# Patient Record
Sex: Female | Born: 1990 | Race: Black or African American | Hispanic: No | Marital: Single | Smoking: Never smoker
Health system: Southern US, Community
[De-identification: ages and names within clinical notes are randomized; demographics above are authoritative.]

## PROBLEM LIST (undated history)

## (undated) ENCOUNTER — Inpatient Hospital Stay (HOSPITAL_COMMUNITY): Payer: Self-pay

## (undated) DIAGNOSIS — J45909 Unspecified asthma, uncomplicated: Secondary | ICD-10-CM

## (undated) DIAGNOSIS — B3731 Acute candidiasis of vulva and vagina: Secondary | ICD-10-CM

## (undated) DIAGNOSIS — B373 Candidiasis of vulva and vagina: Secondary | ICD-10-CM

## (undated) DIAGNOSIS — A599 Trichomoniasis, unspecified: Secondary | ICD-10-CM

## (undated) HISTORY — DX: Candidiasis of vulva and vagina: B37.3

## (undated) HISTORY — DX: Trichomoniasis, unspecified: A59.9

## (undated) HISTORY — DX: Unspecified asthma, uncomplicated: J45.909

## (undated) HISTORY — DX: Acute candidiasis of vulva and vagina: B37.31

---
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
## 2010-02-23 ENCOUNTER — Other Ambulatory Visit: Admission: RE | Admit: 2010-02-23 | Discharge: 2010-02-23 | Payer: Self-pay | Admitting: Family Medicine

## 2010-03-03 ENCOUNTER — Ambulatory Visit (HOSPITAL_COMMUNITY): Admission: RE | Admit: 2010-03-03 | Discharge: 2010-03-03 | Payer: Self-pay | Admitting: Endocrinology

## 2012-06-07 ENCOUNTER — Telehealth: Payer: Self-pay | Admitting: Obstetrics and Gynecology

## 2012-06-07 ENCOUNTER — Encounter: Payer: Self-pay | Admitting: Obstetrics and Gynecology

## 2012-06-07 ENCOUNTER — Ambulatory Visit (INDEPENDENT_AMBULATORY_CARE_PROVIDER_SITE_OTHER): Payer: BC Managed Care – PPO | Admitting: Obstetrics and Gynecology

## 2012-06-07 VITALS — BP 110/72 | HR 82 | Resp 16 | Ht 64.0 in | Wt 156.0 lb

## 2012-06-07 DIAGNOSIS — Z124 Encounter for screening for malignant neoplasm of cervix: Secondary | ICD-10-CM

## 2012-06-07 DIAGNOSIS — B9689 Other specified bacterial agents as the cause of diseases classified elsewhere: Secondary | ICD-10-CM

## 2012-06-07 DIAGNOSIS — Z01419 Encounter for gynecological examination (general) (routine) without abnormal findings: Secondary | ICD-10-CM

## 2012-06-07 DIAGNOSIS — A499 Bacterial infection, unspecified: Secondary | ICD-10-CM

## 2012-06-07 DIAGNOSIS — Z319 Encounter for procreative management, unspecified: Secondary | ICD-10-CM

## 2012-06-07 DIAGNOSIS — N760 Acute vaginitis: Secondary | ICD-10-CM

## 2012-06-07 LAB — HEPATITIS B SURFACE ANTIGEN: Hepatitis B Surface Ag: NEGATIVE

## 2012-06-07 LAB — POCT WET PREP (WET MOUNT)

## 2012-06-07 LAB — PROGESTERONE: Progesterone: 11.7 ng/mL

## 2012-06-07 LAB — TSH: TSH: 0.97 u[IU]/mL (ref 0.350–4.500)

## 2012-06-07 MED ORDER — METRONIDAZOLE 0.75 % VA GEL: 1.0000 | Freq: Every day | VAGINAL | Status: DC

## 2012-06-07 MED ORDER — METRONIDAZOLE 500 MG PO TABS: 500.0000 mg | ORAL_TABLET | Freq: Two times a day (BID) | ORAL | Status: DC

## 2012-06-07 MED ORDER — METRONIDAZOLE 500 MG PO TABS: 500.0000 mg | ORAL_TABLET | Freq: Two times a day (BID) | ORAL | Status: AC

## 2012-06-07 MED ORDER — METRONIDAZOLE 0.75 % VA GEL: 1.0000 | Freq: Every day | VAGINAL | Status: AC

## 2012-06-07 NOTE — Patient Instructions (Signed)
Bacterial Vaginosis Bacterial vaginosis (BV) is a vaginal infection where the normal balance of bacteria in the vagina is disrupted. The normal balance is then replaced by an overgrowth of certain bacteria. There are several different kinds of bacteria that can cause BV. BV is the most common vaginal infection in women of childbearing age. CAUSES   The cause of BV is not fully understood. BV develops when there is an increase or imbalance of harmful bacteria.   Some activities or behaviors can upset the normal balance of bacteria in the vagina and put women at increased risk including:   Having a new sex partner or multiple sex partners.   Douching.   Using an intrauterine device (IUD) for contraception.   It is not clear what role sexual activity plays in the development of BV. However, women that have never had sexual intercourse are rarely infected with BV.  Women do not get BV from toilet seats, bedding, swimming pools or from touching objects around them.  SYMPTOMS   Grey vaginal discharge.   A fish-like odor with discharge, especially after sexual intercourse.   Itching or burning of the vagina and vulva.   Burning or pain with urination.   Some women have no signs or symptoms at all.  DIAGNOSIS  Your caregiver must examine the vagina for signs of BV. Your caregiver will perform lab tests and look at the sample of vaginal fluid through a microscope. They will look for bacteria and abnormal cells (clue cells), a pH test higher than 4.5, and a positive amine test all associated with BV.  RISKS AND COMPLICATIONS   Pelvic inflammatory disease (PID).   Infections following gynecology surgery.   Developing HIV.   Developing herpes virus.  TREATMENT  Sometimes BV will clear up without treatment. However, all women with symptoms of BV should be treated to avoid complications, especially if gynecology surgery is planned. Female partners generally do not need to be treated. However,  BV may spread between female sex partners so treatment is helpful in preventing a recurrence of BV.   BV may be treated with antibiotics. The antibiotics come in either pill or vaginal cream forms. Either can be used with nonpregnant or pregnant women, but the recommended dosages differ. These antibiotics are not harmful to the baby.   BV can recur after treatment. If this happens, a second round of antibiotics will often be prescribed.   Treatment is important for pregnant women. If not treated, BV can cause a premature delivery, especially for a pregnant woman who had a premature birth in the past. All pregnant women who have symptoms of BV should be checked and treated.   For chronic reoccurrence of BV, treatment with a type of prescribed gel vaginally twice a week is helpful.  HOME CARE INSTRUCTIONS   Finish all medication as directed by your caregiver.   Do not have sex until treatment is completed.   Tell your sexual partner that you have a vaginal infection. They should see their caregiver and be treated if they have problems, such as a mild rash or itching.   Practice safe sex. Use condoms. Only have 1 sex partner.  PREVENTION  Basic prevention steps can help reduce the risk of upsetting the natural balance of bacteria in the vagina and developing BV:  Do not have sexual intercourse (be abstinent).   Do not douche.   Use all of the medicine prescribed for treatment of BV, even if the signs and symptoms go away.     Tell your sex partner if you have BV. That way, they can be treated, if needed, to prevent reoccurrence.  SEEK MEDICAL CARE IF:   Your symptoms are not improving after 3 days of treatment.   You have increased discharge, pain, or fever.  MAKE SURE YOU:   Understand these instructions.   Will watch your condition.   Will get help right away if you are not doing well or get worse.  FOR MORE INFORMATION  Division of STD Prevention (DSTDP), Centers for Disease  Control and Prevention: www.cdc.gov/std American Social Health Association (ASHA): www.ashastd.org  Document Released: 09/11/2005 Document Revised: 08/31/2011 Document Reviewed: 03/04/2009 Exit[employer] Patient Information 2012 [employer]. 

## 2012-06-07 NOTE — Telephone Encounter (Signed)
Spoke with pt rgd msg informed rx sent to pharm pt voice understanding 

## 2012-06-07 NOTE — Progress Notes (Signed)
Last Pap: per pt cannot recall WNL: NO Regular Periods:yes Contraception: Condoms smetimes  Monthly Breast exam:no Tetanus<19yrs:yes Nl.Bladder Function:yes Daily BMs:yes Healthy Diet:yes Calcium:no Mammogram:no Date of Mammogram: N//A Exercise:yes Have often Exercise: daily walking Seatbelt: yes Abuse at home: no Stressful work:no Sigmoid-colonoscopy: N/A Bone Density: No PCP: [facility] Dr.Einger Change in PMH: No Changes Change in FMH:No Changes.  Color: N/A Odor: yes Itching:no Thin:no Thick:no Fever:no Dyspareunia:no Hx PID:no HX STD:yes Pelvic Pain:no Desires Gc/CT:yes Desires HIV,RPR,HbsAG:yes BP 110/72  Pulse 82  Resp 16  Ht 5\' 4"  (1.626 m)  Wt 156 lb (70.761 kg)  BMI 26.78 kg/m2  LMP 05/18/2012 Physical Examination: General appearance - alert, well appearing, and in no distress Chest - clear to auscultation, no wheezes, rales or rhonchi, symmetric air entry Heart - normal rate and regular rhythm Abdomen - soft, nontender, nondistended, no masses or organomegaly Breasts - breasts appear normal, no suspicious masses, no skin or nipple changes or axillary nodes Pelvic - normal external genitalia, vulva, vagina, cervix, uterus and adnexa Musculoskeletal - no joint tenderness, deformity or swelling Wet prep c/ BV Recurrent BV metrogel and flagyl given.  Perineal hygeine reviewed.  Pap sent.

## 2012-06-08 LAB — RPR

## 2012-06-10 LAB — HSV 2 ANTIBODY, IGG: HSV 2 Glycoprotein G Ab, IgG: 14.58 IV — ABNORMAL HIGH

## 2012-06-10 LAB — HSV 1 ANTIBODY, IGG: HSV 1 Glycoprotein G Ab, IgG: <0.1 IV

## 2012-06-11 LAB — PAP IG, CT-NG, RFX HPV ASCU: GC Probe Amp: NEGATIVE

## 2012-06-14 ENCOUNTER — Telehealth: Payer: Self-pay

## 2012-06-14 NOTE — Telephone Encounter (Signed)
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

## 2012-06-14 NOTE — Telephone Encounter (Signed)
Spoke with pt rgd labs informed hsv 2 positive explained hsv 2 to pt afvised if outbreak occur call office for rx also informed pt pap showed abnl cells need colpo pt has appt 07/12/12 at 3:15 with ND pt voice understading

## 2012-07-01 ENCOUNTER — Encounter: Payer: BC Managed Care – PPO | Admitting: Obstetrics and Gynecology

## 2012-07-12 ENCOUNTER — Encounter: Payer: BC Managed Care – PPO | Admitting: Obstetrics and Gynecology

## 2012-08-02 ENCOUNTER — Telehealth: Payer: Self-pay | Admitting: Obstetrics and Gynecology

## 2012-08-02 NOTE — Telephone Encounter (Signed)
ND pt 

## 2012-08-05 ENCOUNTER — Telehealth: Payer: Self-pay

## 2012-08-05 NOTE — Telephone Encounter (Signed)
Try calling pt rgd r/s colpo no answer unable to leave msg 

## 2012-08-07 NOTE — Telephone Encounter (Signed)
Lm on vm tcb rgd r/s colpo

## 2012-08-08 NOTE — Telephone Encounter (Signed)
Spoke with pt rgd r/s colpo pt r/s colpo to 10/05/11 at 2:45 with Nd pt voice understanding

## 2012-08-08 NOTE — Telephone Encounter (Signed)
Lm on vm tcb rgd r/s colpo 

## 2012-08-29 ENCOUNTER — Telehealth: Payer: Self-pay | Admitting: Obstetrics and Gynecology

## 2012-08-29 NOTE — Telephone Encounter (Signed)
Tc to pt, c/o thick white discharge, states she does not think it is a yeast infection because she is not having any itching and would like to been seen asap. Advised pt that 1st available is with [facility] on 09/04/12, pt agreeable.

## 2012-09-04 ENCOUNTER — Encounter: Payer: BC Managed Care – PPO | Admitting: Obstetrics and Gynecology

## 2012-09-10 ENCOUNTER — Encounter: Payer: BC Managed Care – PPO | Admitting: Obstetrics and Gynecology

## 2012-09-11 DIAGNOSIS — B009 Herpesviral infection, unspecified: Secondary | ICD-10-CM | POA: Insufficient documentation

## 2012-09-12 ENCOUNTER — Encounter: Payer: Self-pay | Admitting: Obstetrics and Gynecology

## 2012-09-12 ENCOUNTER — Ambulatory Visit (INDEPENDENT_AMBULATORY_CARE_PROVIDER_SITE_OTHER): Payer: BC Managed Care – PPO | Admitting: Obstetrics and Gynecology

## 2012-09-12 VITALS — BP 100/70 | Resp 14 | Ht 65.0 in | Wt 157.0 lb

## 2012-09-12 DIAGNOSIS — B3731 Acute candidiasis of vulva and vagina: Secondary | ICD-10-CM | POA: Insufficient documentation

## 2012-09-12 DIAGNOSIS — B373 Candidiasis of vulva and vagina: Secondary | ICD-10-CM | POA: Insufficient documentation

## 2012-09-12 DIAGNOSIS — B009 Herpesviral infection, unspecified: Secondary | ICD-10-CM

## 2012-09-12 LAB — POCT WET PREP (WET MOUNT)
Clue Cells Wet Prep Whiff POC: POSITIVE
pH: 5

## 2012-09-12 MED ORDER — TERCONAZOLE 0.4 % VA CREA: 1.0000 | TOPICAL_CREAM | Freq: Every day | VAGINAL | Status: DC

## 2012-09-12 NOTE — Progress Notes (Signed)
Patient ID: Maria Greer, female   DOB: 1991/05/09, 21 y.o.   MRN: [id number] Color: white Odor: yes Itching:no Thin:no Thick:yes Fever:no Dyspareunia:no Hx PID:no HX STD:HSV ll Pelvic Pain:no Desires Gc/CT:yes Desires HIV,RPR,HbsAG:no

## 2012-09-12 NOTE — Progress Notes (Signed)
Complaint of vag odor no irritation requests gc/chl only, plans to come for colpo 1/10 Abd soft, nt Normal hair distrubition mons pubis,  EGBUS WNL, sterile speculum exam,  vagina pink, moist normal rugae,  cerix LTC, white discharge Wet prep +hyphae, neg clue, neg trich GC/CHL pending terazole 7 discussed and baking soda bathes.  Lavera Guise, CNM

## 2012-10-04 ENCOUNTER — Ambulatory Visit (INDEPENDENT_AMBULATORY_CARE_PROVIDER_SITE_OTHER): Payer: BC Managed Care – PPO | Admitting: Obstetrics and Gynecology

## 2012-10-04 VITALS — BP 110/70 | Ht 65.0 in | Wt 157.0 lb

## 2012-10-04 DIAGNOSIS — N870 Mild cervical dysplasia: Secondary | ICD-10-CM | POA: Insufficient documentation

## 2012-10-04 DIAGNOSIS — R6889 Other general symptoms and signs: Secondary | ICD-10-CM

## 2012-10-04 DIAGNOSIS — IMO0002 Reserved for concepts with insufficient information to code with codable children: Secondary | ICD-10-CM

## 2012-10-04 HISTORY — DX: Mild cervical dysplasia: N87.0

## 2012-10-04 LAB — POCT URINE PREGNANCY: Preg Test, Ur: NEGATIVE

## 2012-10-04 NOTE — Progress Notes (Signed)
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  Pt stable.  Pathophysiology reviewed

## 2012-10-04 NOTE — Addendum Note (Signed)
Addended by: Loralyn Freshwater on: 10/04/2012 04:02 PM   Modules accepted: Orders

## 2012-10-08 ENCOUNTER — Telehealth: Payer: Self-pay | Admitting: Obstetrics and Gynecology

## 2012-10-08 LAB — PATHOLOGY

## 2012-10-08 NOTE — Telephone Encounter (Signed)
Tc to pt per telephone call. Appt sched 10/14/12 @ 3:15 with ND for eval due to pt going out of town. Pt agrees.

## 2012-10-10 ENCOUNTER — Telehealth: Payer: Self-pay

## 2012-10-10 NOTE — Telephone Encounter (Signed)
Message copied by Rolla Plate on Thu Oct 10, 2012 11:33 AM ------      Message from: Maria Greer      Created: Wed Oct 09, 2012  5:26 PM       Please review colpo results with patient and tell her I recommend a pap every six months for the next year.

## 2012-10-10 NOTE — Telephone Encounter (Signed)
Try calling pt rgd labs no answer unable to leave msg 

## 2012-10-14 ENCOUNTER — Encounter: Payer: Self-pay | Admitting: Obstetrics and Gynecology

## 2012-10-14 ENCOUNTER — Ambulatory Visit: Payer: BC Managed Care – PPO | Admitting: Obstetrics and Gynecology

## 2012-10-14 VITALS — BP 116/70 | Wt 160.0 lb

## 2012-10-14 DIAGNOSIS — N898 Other specified noninflammatory disorders of vagina: Secondary | ICD-10-CM

## 2012-10-14 LAB — POCT WET PREP (WET MOUNT)
Clue Cells Wet Prep Whiff POC: POSITIVE
PH, VAGINAL: 5

## 2012-10-14 MED ORDER — METRONIDAZOLE 500 MG PO TABS: 500.0000 mg | ORAL_TABLET | Freq: Two times a day (BID) | ORAL | Status: DC

## 2012-10-14 NOTE — Progress Notes (Signed)
Color: none Odor: yes Itching:no Thin:no Thick:yes Fever:no Dyspareunia:no Hx PID:no HX STD:yes trich Pelvic Pain:no Desires Gc/CT:no Desires HIV,RPR,HbsAG:no BP 116/70  Wt 160 lb (72.576 kg)  LMP 09/24/2012 Pt with an abnormal discharge for two months Physical Examination: General appearance - alert, well appearing, and in no distress Abdomen - soft, nontender, nondistended, no masses or organomegaly Pelvic - normal external genitalia, vulva, vagina, cervix, uterus and adnexa Wet prep c/w BV BV Flagyl rx Discussed perineal hygeine LGSIL repeat pap in 6 months

## 2012-10-14 NOTE — Patient Instructions (Signed)
Bacterial Vaginosis Bacterial vaginosis (BV) is a vaginal infection where the normal balance of bacteria in the vagina is disrupted. The normal balance is then replaced by an overgrowth of certain bacteria. There are several different kinds of bacteria that can cause BV. BV is the most common vaginal infection in women of childbearing age. CAUSES   The cause of BV is not fully understood. BV develops when there is an increase or imbalance of harmful bacteria.  Some activities or behaviors can upset the normal balance of bacteria in the vagina and put women at increased risk including:  Having a new sex partner or multiple sex partners.  Douching.  Using an intrauterine device (IUD) for contraception.  It is not clear what role sexual activity plays in the development of BV. However, women that have never had sexual intercourse are rarely infected with BV. Women do not get BV from toilet seats, bedding, swimming pools or from touching objects around them.  SYMPTOMS   Grey vaginal discharge.  A fish-like odor with discharge, especially after sexual intercourse.  Itching or burning of the vagina and vulva.  Burning or pain with urination.  Some women have no signs or symptoms at all. DIAGNOSIS  Your caregiver must examine the vagina for signs of BV. Your caregiver will perform lab tests and look at the sample of vaginal fluid through a microscope. They will look for bacteria and abnormal cells (clue cells), a pH test higher than 4.5, and a positive amine test all associated with BV.  RISKS AND COMPLICATIONS   Pelvic inflammatory disease (PID).  Infections following gynecology surgery.  Developing HIV.  Developing herpes virus. TREATMENT  Sometimes BV will clear up without treatment. However, all women with symptoms of BV should be treated to avoid complications, especially if gynecology surgery is planned. Female partners generally do not need to be treated. However, BV may spread  between female sex partners so treatment is helpful in preventing a recurrence of BV.   BV may be treated with antibiotics. The antibiotics come in either pill or vaginal cream forms. Either can be used with nonpregnant or pregnant women, but the recommended dosages differ. These antibiotics are not harmful to the baby.  BV can recur after treatment. If this happens, a second round of antibiotics will often be prescribed.  Treatment is important for pregnant women. If not treated, BV can cause a premature delivery, especially for a pregnant woman who had a premature birth in the past. All pregnant women who have symptoms of BV should be checked and treated.  For chronic reoccurrence of BV, treatment with a type of prescribed gel vaginally twice a week is helpful. HOME CARE INSTRUCTIONS   Finish all medication as directed by your caregiver.  Do not have sex until treatment is completed.  Tell your sexual partner that you have a vaginal infection. They should see their caregiver and be treated if they have problems, such as a mild rash or itching.  Practice safe sex. Use condoms. Only have 1 sex partner. PREVENTION  Basic prevention steps can help reduce the risk of upsetting the natural balance of bacteria in the vagina and developing BV:  Do not have sexual intercourse (be abstinent).  Do not douche.  Use all of the medicine prescribed for treatment of BV, even if the signs and symptoms go away.  Tell your sex partner if you have BV. That way, they can be treated, if needed, to prevent reoccurrence. SEEK MEDICAL CARE IF:     Your symptoms are not improving after 3 days of treatment.  You have increased discharge, pain, or fever. MAKE SURE YOU:   Understand these instructions.  Will watch your condition.  Will get help right away if you are not doing well or get worse. FOR MORE INFORMATION  Division of STD Prevention (DSTDP), Centers for Disease Control and Prevention:  www.cdc.gov/std American Social Health Association (ASHA): www.ashastd.org  Document Released: 09/11/2005 Document Revised: 12/04/2011 Document Reviewed: 03/04/2009 ExitCare Patient Information 2013 [employer].  

## 2013-03-08 ENCOUNTER — Emergency Department (HOSPITAL_COMMUNITY): Payer: BC Managed Care – PPO

## 2013-03-08 ENCOUNTER — Encounter (HOSPITAL_COMMUNITY): Payer: Self-pay

## 2013-03-08 ENCOUNTER — Emergency Department (HOSPITAL_COMMUNITY)
Admission: EM | Admit: 2013-03-08 | Discharge: 2013-03-08 | Disposition: A | Discharge status: Home / Self Care | Payer: BC Managed Care – PPO | Attending: Emergency Medicine | Admitting: Emergency Medicine

## 2013-03-08 DIAGNOSIS — N390 Urinary tract infection, site not specified: Secondary | ICD-10-CM

## 2013-03-08 DIAGNOSIS — Z3202 Encounter for pregnancy test, result negative: Secondary | ICD-10-CM | POA: Insufficient documentation

## 2013-03-08 DIAGNOSIS — N949 Unspecified condition associated with female genital organs and menstrual cycle: Secondary | ICD-10-CM | POA: Insufficient documentation

## 2013-03-08 DIAGNOSIS — Z8619 Personal history of other infectious and parasitic diseases: Secondary | ICD-10-CM | POA: Insufficient documentation

## 2013-03-08 DIAGNOSIS — R102 Pelvic and perineal pain: Secondary | ICD-10-CM

## 2013-03-08 DIAGNOSIS — J45909 Unspecified asthma, uncomplicated: Secondary | ICD-10-CM | POA: Insufficient documentation

## 2013-03-08 LAB — URINALYSIS, ROUTINE W REFLEX MICROSCOPIC
Nitrite: NEGATIVE
pH: 7.5 (ref 5.0–8.0)

## 2013-03-08 LAB — POCT PREGNANCY, URINE: Preg Test, Ur: NEGATIVE

## 2013-03-08 LAB — CBC WITH DIFFERENTIAL/PLATELET
Eosinophils Absolute: 0.2 10*3/uL (ref 0.0–0.7)
Eosinophils Relative: 2 % (ref 0–5)
HCT: 35.5 % — ABNORMAL LOW (ref 36.0–46.0)
Hemoglobin: 12.1 g/dL (ref 12.0–15.0)
Lymphs Abs: 3.1 10*3/uL (ref 0.7–4.0)
Monocytes Relative: 6 % (ref 3–12)
Neutro Abs: 6.9 10*3/uL (ref 1.7–7.7)
Neutrophils Relative %: 64 % (ref 43–77)
RBC: 3.86 MIL/uL — ABNORMAL LOW (ref 3.87–5.11)

## 2013-03-08 LAB — URINE MICROSCOPIC-ADD ON

## 2013-03-08 LAB — BASIC METABOLIC PANEL
BUN: 10 mg/dL (ref 6–23)
CO2: 26 mEq/L (ref 19–32)
Calcium: 9.1 mg/dL (ref 8.4–10.5)
Chloride: 104 mEq/L (ref 96–112)
GFR calc Af Amer: >90 mL/min (ref >90)
GFR calc non Af Amer: >90 mL/min (ref >90)
Glucose, Bld: 99 mg/dL (ref 70–99)

## 2013-03-08 LAB — WET PREP, GENITAL
Trich, Wet Prep: NONE SEEN
Yeast Wet Prep HPF POC: NONE SEEN

## 2013-03-08 MED ORDER — CIPROFLOXACIN HCL 500 MG PO TABS
500.0000 mg | ORAL_TABLET | Freq: Once | ORAL | Status: AC
  Administered 2013-03-08: 500 mg via ORAL
  Filled 2013-03-08: qty 1

## 2013-03-08 MED ORDER — IBUPROFEN 800 MG PO TABS
800.0000 mg | ORAL_TABLET | Freq: Once | ORAL | Status: AC
  Administered 2013-03-08: 800 mg via ORAL
  Filled 2013-03-08: qty 1

## 2013-03-08 MED ORDER — HYDROCODONE-ACETAMINOPHEN 5-325 MG PO TABS: 1.0000 | ORAL_TABLET | Freq: Four times a day (QID) | ORAL | Status: DC | PRN

## 2013-03-08 MED ORDER — CIPROFLOXACIN HCL 500 MG PO TABS: 500.0000 mg | ORAL_TABLET | Freq: Two times a day (BID) | ORAL | Status: DC

## 2013-03-08 MED ORDER — IBUPROFEN 800 MG PO TABS: 800.0000 mg | ORAL_TABLET | Freq: Four times a day (QID) | ORAL | Status: DC | PRN

## 2013-03-08 MED ORDER — PHENAZOPYRIDINE HCL 200 MG PO TABS
200.0000 mg | ORAL_TABLET | Freq: Three times a day (TID) | ORAL | Status: DC
  Filled 2013-03-08: qty 1

## 2013-03-08 MED ORDER — PHENAZOPYRIDINE HCL 200 MG PO TABS
200.0000 mg | ORAL_TABLET | Freq: Three times a day (TID) | ORAL | Status: DC
  Administered 2013-03-08: 200 mg via ORAL

## 2013-03-08 MED ORDER — HYDROCODONE-ACETAMINOPHEN 5-325 MG PO TABS
2.0000 | ORAL_TABLET | Freq: Once | ORAL | Status: AC
  Administered 2013-03-08: 2 via ORAL
  Filled 2013-03-08: qty 2

## 2013-03-08 MED ORDER — PHENAZOPYRIDINE HCL 200 MG PO TABS: 200.0000 mg | ORAL_TABLET | Freq: Three times a day (TID) | ORAL | Status: DC

## 2013-03-08 NOTE — ED Notes (Signed)
POCT Preg test resulted neg.

## 2013-03-08 NOTE — ED Notes (Signed)
Pt reports lower abdominal pain with sharp shooting pains that woke her from her sleep. Pt denies n/v/d. Pt states that she has not had any pain like this before. Appendix still in place.

## 2013-03-08 NOTE — ED Provider Notes (Signed)
History     CSN: [id number]  Arrival date & time 03/08/13  0116   First MD Initiated Contact with Patient 03/08/13 0134      Chief Complaint  Patient presents with  . Abdominal Pain    (Consider location/radiation/quality/duration/timing/severity/associated sxs/prior treatment) HPI 22 year old female presents to emergency room with complaint of acute onset lower abdominal pain.  Pain started about an hour ago.  It is sharp in nature.  Patient reports LMP was 2 weeks ago.  She denies any problems with urination, no history of kidney stones, no blood in urine.  She denies any vaginal discharge.  No recent change in sexual partners.  Last meal.  Around 6 PM.  Last bowel movement this morning.  She has been in otherwise good health.  She denies any nausea or vomiting, fevers or chills.   Pain woke her from sleep Past Medical History  Diagnosis Date  . Asthma   . Yeast vaginitis   . Trichomonas     History reviewed. No pertinent past surgical history.  Family History  Problem Relation Age of Onset  . Cancer Maternal Grandmother 69    Breast   . Diabetes Maternal Grandmother   . Hypertension Mother     History  Substance Use Topics  . Smoking status: Never Smoker   . Smokeless tobacco: Never Used  . Alcohol Use: Yes     Comment: socially    OB History   Grav Para Term Preterm Abortions TAB SAB Ect Mult Living   0 0              Review of Systems  See History of Present Illness; otherwise all other systems are reviewed and negative Allergies  Review of patient's allergies indicates no known allergies.  Home Medications  No current outpatient prescriptions on file.  BP 124/67  Pulse 78  Temp(Src) 98.4 F (36.9 C) (Oral)  Resp 18  Ht 5\' 5"  (1.651 m)  Wt 161 lb 8 oz (73.256 kg)  BMI 26.88 kg/m2  SpO2 99%  LMP 02/18/2013  Physical Exam  Nursing note and vitals reviewed. Constitutional: She is oriented to person, place, and time. She appears well-developed and  well-nourished.  HENT:  Head: Normocephalic and atraumatic.  Left Ear: External ear normal.  Mouth/Throat: Oropharynx is clear and moist.  Eyes: Conjunctivae and EOM are normal. Pupils are equal, round, and reactive to light.  Neck: Normal range of motion. Neck supple. No JVD present. No tracheal deviation present. No thyromegaly present.  Cardiovascular: Normal rate, regular rhythm, normal heart sounds and intact distal pulses.  Exam reveals no gallop and no friction rub.   No murmur heard. Pulmonary/Chest: Effort normal and breath sounds normal. No stridor. No respiratory distress. She has no wheezes. She has no rales. She exhibits no tenderness.  Abdominal: Soft. Bowel sounds are normal. She exhibits no distension and no mass. There is tenderness (tender to palpation across lower abdomen, worse in the suprapubic region). There is no rebound and no guarding.  Genitourinary:  External genitalia normal Vagina without discharge Cervix closed no lesions Mild cervical motion tenderness Adnexa palpated, no masses or tenderness noted on the left, tender on the right Bladder palpated moderate tenderness Uterus palpated no masses but moderate tenderness    Musculoskeletal: Normal range of motion. She exhibits no edema and no tenderness.  Lymphadenopathy:    She has no cervical adenopathy.  Neurological: She is alert and oriented to person, place, and time. She has normal reflexes. She  exhibits normal muscle tone. Coordination normal.  Skin: Skin is warm and dry. No rash noted. No erythema. No pallor.  Psychiatric: She has a normal mood and affect. Her behavior is normal. Judgment and thought content normal.    ED Course  Procedures (including critical care time)  Labs Reviewed  WET PREP, GENITAL - Abnormal; Notable for the following:    Clue Cells Wet Prep HPF POC RARE (*)    WBC, Wet Prep HPF POC RARE (*)    All other components within normal limits  CBC WITH DIFFERENTIAL - Abnormal;  Notable for the following:    WBC 10.8 (*)    RBC 3.86 (*)    HCT 35.5 (*)    All other components within normal limits  URINALYSIS, ROUTINE W REFLEX MICROSCOPIC - Abnormal; Notable for the following:    Hgb urine dipstick TRACE (*)    Leukocytes, UA MODERATE (*)    All other components within normal limits  GC/CHLAMYDIA PROBE AMP  URINE CULTURE  BASIC METABOLIC PANEL  URINE MICROSCOPIC-ADD ON  POCT PREGNANCY, URINE   No results found.   1. UTI (lower urinary tract infection)   2. Pelvic pain       MDM  22 yo female with acute lower abd pain.  Differential includes appendicitis, ovarian torsion, ectopic pregnancy, ovarian cyst rupture, UTI, PID.  Will get labwork, ua, upreg and complete pelvic exam.        Olivia Mackie, MD 03/08/13 [phone]

## 2013-03-08 NOTE — ED Notes (Signed)
Pt still unable to urinate 

## 2013-03-08 NOTE — ED Notes (Signed)
Pt sts she is still not able to urinate.

## 2013-03-08 NOTE — ED Notes (Signed)
Per Dr. Norlene Campbell, pt ok to be d/c at this time.

## 2013-03-09 LAB — URINE CULTURE

## 2013-06-21 ENCOUNTER — Emergency Department (HOSPITAL_COMMUNITY)
Admission: EM | Admit: 2013-06-21 | Discharge: 2013-06-21 | Disposition: A | Discharge status: Home / Self Care | Payer: BC Managed Care – PPO | Attending: Emergency Medicine | Admitting: Emergency Medicine

## 2013-06-21 ENCOUNTER — Encounter (HOSPITAL_COMMUNITY): Payer: Self-pay

## 2013-06-21 ENCOUNTER — Encounter (HOSPITAL_COMMUNITY): Payer: Self-pay | Admitting: Emergency Medicine

## 2013-06-21 DIAGNOSIS — H6691 Otitis media, unspecified, right ear: Secondary | ICD-10-CM

## 2013-06-21 DIAGNOSIS — J45909 Unspecified asthma, uncomplicated: Secondary | ICD-10-CM | POA: Insufficient documentation

## 2013-06-21 DIAGNOSIS — Z792 Long term (current) use of antibiotics: Secondary | ICD-10-CM | POA: Insufficient documentation

## 2013-06-21 DIAGNOSIS — H60399 Other infective otitis externa, unspecified ear: Secondary | ICD-10-CM | POA: Insufficient documentation

## 2013-06-21 DIAGNOSIS — H6091 Unspecified otitis externa, right ear: Secondary | ICD-10-CM

## 2013-06-21 DIAGNOSIS — Z8619 Personal history of other infectious and parasitic diseases: Secondary | ICD-10-CM | POA: Insufficient documentation

## 2013-06-21 DIAGNOSIS — R599 Enlarged lymph nodes, unspecified: Secondary | ICD-10-CM | POA: Insufficient documentation

## 2013-06-21 DIAGNOSIS — H6690 Otitis media, unspecified, unspecified ear: Secondary | ICD-10-CM | POA: Insufficient documentation

## 2013-06-21 DIAGNOSIS — R51 Headache: Secondary | ICD-10-CM | POA: Insufficient documentation

## 2013-06-21 DIAGNOSIS — Z79899 Other long term (current) drug therapy: Secondary | ICD-10-CM | POA: Insufficient documentation

## 2013-06-21 MED ORDER — CIPROFLOXACIN-DEXAMETHASONE 0.3-0.1 % OT SUSP: 4.0000 [drp] | Freq: Two times a day (BID) | OTIC | Status: DC

## 2013-06-21 MED ORDER — ANTIPYRINE-BENZOCAINE 5.4-1.4 % OT SOLN
3.0000 [drp] | Freq: Once | OTIC | Status: AC
Start: 2013-06-21 — End: 2013-06-21
  Administered 2013-06-21: 4 [drp] via OTIC
  Filled 2013-06-21: qty 10

## 2013-06-21 MED ORDER — KETOROLAC TROMETHAMINE 60 MG/2ML IM SOLN
60.0000 mg | Freq: Once | INTRAMUSCULAR | Status: AC
  Administered 2013-06-21: 60 mg via INTRAMUSCULAR
  Filled 2013-06-21: qty 2

## 2013-06-21 MED ORDER — ANTIPYRINE-BENZOCAINE 5.4-1.4 % OT SOLN: 3.0000 [drp] | OTIC | Status: DC | PRN

## 2013-06-21 MED ORDER — AZITHROMYCIN 250 MG PO TABS: ORAL_TABLET | ORAL | Status: DC

## 2013-06-21 NOTE — ED Notes (Addendum)
Pt c/o R ear pain x 3 weeks.  Denies injury and drainage.  Pain score 10/10.  Pt was seen at [facility] this morning for same.  Sts "the pain just started getting worse, so I came back."

## 2013-06-21 NOTE — ED Provider Notes (Signed)
CSN: [id number]     Arrival date & time 06/21/13  0544 History   First MD Initiated Contact with Patient 06/21/13 [phone]     Chief Complaint  Patient presents with  . Otalgia   (Consider location/radiation/quality/duration/timing/severity/associated sxs/prior Treatment) HPI Comments: 22 year old female presents to the emergency department complaining of right ear pain x2 weeks. Patient states she was treated for an ear infection 2 weeks ago with amoxicillin, pain initially went away when she first started the amoxicillin, however 3 days later pain returned. States she has a "knot" behind her right ear. Admits to muffled hearing. Denies ear discharge. Denies fever or chills.  Patient is a 22 y.o. female presenting with ear pain. The history is provided by the patient.  Otalgia Associated symptoms: no fever     Past Medical History  Diagnosis Date  . Asthma   . Yeast vaginitis   . Trichomonas    History reviewed. No pertinent past surgical history. Family History  Problem Relation Age of Onset  . Cancer Maternal Grandmother 49    Breast   . Diabetes Maternal Grandmother   . Hypertension Mother    History  Substance Use Topics  . Smoking status: Never Smoker   . Smokeless tobacco: Never Used  . Alcohol Use: Yes     Comment: socially   OB History   Grav Para Term Preterm Abortions TAB SAB Ect Mult Living   0 0             Review of Systems  Constitutional: Negative for fever and chills.  HENT: Positive for ear pain.   Hematological: Positive for adenopathy.  All other systems reviewed and are negative.    Allergies  Review of patient's allergies indicates no known allergies.  Home Medications   Current Outpatient Rx  Name  Route  Sig  Dispense  Refill  . ciprofloxacin (CIPRO) 500 MG tablet   Oral   Take 1 tablet (500 mg total) by mouth 2 (two) times daily.   10 tablet   0   . HYDROcodone-acetaminophen (NORCO/VICODIN) 5-325 MG per tablet   Oral   Take 1 tablet  by mouth every 6 (six) hours as needed for pain.   20 tablet   0   . ibuprofen (ADVIL,MOTRIN) 800 MG tablet   Oral   Take 1 tablet (800 mg total) by mouth every 6 (six) hours as needed for pain.   30 tablet   0   . phenazopyridine (PYRIDIUM) 200 MG tablet   Oral   Take 1 tablet (200 mg total) by mouth 3 (three) times daily with meals.   10 tablet   0    BP 121/76  Pulse 71  Temp(Src) 98.8 F (37.1 C) (Oral)  Resp 16  Ht 5\' 5"  (1.651 m)  Wt 158 lb (71.668 kg)  BMI 26.29 kg/m2  SpO2 100%  LMP 05/31/2013 Physical Exam  Nursing note and vitals reviewed. Constitutional: She is oriented to person, place, and time. She appears well-developed and well-nourished. No distress.  HENT:  Head: Normocephalic and atraumatic.  Mouth/Throat: Oropharynx is clear and moist.  Right tympanic membrane injected, retracted. No middle ear effusion or discharge. Right ear canal inflamed, erythematous and tender.  Eyes: Conjunctivae are normal.  Neck: Normal range of motion. Neck supple.  Cardiovascular: Normal rate, regular rhythm and normal heart sounds.   Pulmonary/Chest: Effort normal and breath sounds normal.  Musculoskeletal: Normal range of motion. She exhibits no edema.  Lymphadenopathy:  Head (right side): Posterior auricular adenopathy present.  Neurological: She is alert and oriented to person, place, and time.  Skin: Skin is warm and dry. She is not diaphoretic.  Psychiatric: She has a normal mood and affect. Her behavior is normal.    ED Course  Procedures (including critical care time) Labs Review Labs Reviewed - No data to display Imaging Review No results found.  MDM   1. Otitis media, right   2. Otitis externa, right    Patient with both otitis media and externa. Treatment failure with amoxicillin. Will put on azithromycin. Ciprodex for otitis externa. Return precautions discussed. Patient states understanding of plan and is agreeable.    Trevor Mace,  PA-C 06/21/13 [phone]

## 2013-06-21 NOTE — ED Provider Notes (Signed)
CSN: [id number]     Arrival date & time 06/21/13  1328 History  This chart was scribed for non-physician practitioner, Francee Piccolo, PA-C,working with Dagmar Hait, MD, by Karle Plumber, ED Scribe.  This patient was seen in room WTR7/WTR7 and the patient's care was started at 1:48 PM.  Chief Complaint  Patient presents with  . Otalgia   The history is provided by the patient. No language interpreter was used.   HPI Comments:  Maria Greer is a 22 y.o. female who presents to the Emergency Department complaining of severe worsening right sharp ear pain with radiation to right side of face. Pt states she was seen this morning and began first dose of her antibiotics (Azithromycin and Ciprodex) and has felt worse since then. Pt states she was seen originally for symptoms three weeks ago and was treated with amoxicillin with mild relief. Pt states nothing makes her pain better or worse. She denies fevers, chills, ear drainage, cough, sore throat, or rhinorrhea.   Past Medical History  Diagnosis Date  . Asthma   . Yeast vaginitis   . Trichomonas    History reviewed. No pertinent past surgical history. Family History  Problem Relation Age of Onset  . Cancer Maternal Grandmother 13    Breast   . Diabetes Maternal Grandmother   . Hypertension Mother    History  Substance Use Topics  . Smoking status: Never Smoker   . Smokeless tobacco: Never Used  . Alcohol Use: Yes     Comment: socially   OB History   Grav Para Term Preterm Abortions TAB SAB Ect Mult Living   0 0             Review of Systems  Constitutional: Negative for fever and chills.  HENT: Positive for ear pain. Negative for sore throat, rhinorrhea and ear discharge.   Respiratory: Negative for cough.   Neurological: Positive for headaches.    Allergies  Review of patient's allergies indicates no known allergies.  Home Medications   Current Outpatient Rx  Name  Route  Sig  Dispense  Refill  .  azithromycin (ZITHROMAX) 250 MG tablet   Oral   Take 250-500 mg by mouth daily. 2 tabs on Day 1, then 1 tab daily until all taken.  Started 06/21/13         . ciprofloxacin-dexamethasone (CIPRODEX) otic suspension   Right Ear   Place 4 drops into the right ear 2 (two) times daily.   7.5 mL   0   . amoxicillin (AMOXIL) 500 MG capsule   Oral   Take 500 mg by mouth 2 (two) times daily.         Marland Kitchen antipyrine-benzocaine (AURALGAN) otic solution   Right Ear   Place 3-4 drops into the right ear every 2 (two) hours as needed for pain.   10 mL   0    Triage Vitals: BP 115/60  Pulse 74  Temp(Src) 98.6 F (37 C) (Oral)  Resp 16  SpO2 99%  LMP 05/31/2013 Physical Exam  Nursing note and vitals reviewed. Constitutional: She is oriented to person, place, and time. She appears well-developed and well-nourished. No distress.  HENT:  Head: Normocephalic and atraumatic.  Right Ear: External ear normal.  Left Ear: Tympanic membrane, external ear and ear canal normal. No tenderness. No mastoid tenderness.  Nose: Nose normal.  Mouth/Throat: Uvula is midline, oropharynx is clear and moist and mucous membranes are normal.  Right tympanic membrane injected, retracted. No  middle ear effusion or drainage. Right ear canal inflamed, erythematous and tender.   Eyes: Conjunctivae are normal.  Neck: Neck supple.  Cardiovascular: Normal rate, regular rhythm and normal heart sounds.   Pulmonary/Chest: Effort normal and breath sounds normal. No respiratory distress.  Lymphadenopathy:       Head (right side): Posterior auricular adenopathy present.  Neurological: She is alert and oriented to person, place, and time.  Skin: Skin is warm and dry. She is not diaphoretic.  Psychiatric: She has a normal mood and affect.    ED Course  Procedures (including critical care time) DIAGNOSTIC STUDIES: Oxygen Saturation is 99% on RA, normal by my interpretation.   COORDINATION OF CARE: 1:52 PM- Will  prescribe Auralgan ear drops and Augmentin and give ketorolac her in the ED for pain. Pt verbalizes understanding and agrees to plan.  Medications  antipyrine-benzocaine (AURALGAN) otic solution 3-4 drop (4 drops Right Ear Given 06/21/13 1412)  ketorolac (TORADOL) injection 60 mg (60 mg Intramuscular Given 06/21/13 1412)    Labs Review Labs Reviewed - No data to display Imaging Review No results found.  MDM   1. Otitis media, right   2. Otitis externa, right    Afebrile, NAD, non-toxic appearing, AAOx4. Patient returning to ED for continued pain from R otitis media and externa. No changes in PE from presentation earlier. Provided Auralgan drops for relief. Advised patient again that it will take time for the antibiotics to work for improvement. Return precautions discussed. Patient is agreeable to plan. Patient is stable at time of discharge     I personally performed the services described in this documentation, which was scribed in my presence. The recorded information has been reviewed and is accurate.    Jeannetta Ellis, PA-C 06/21/13 1731

## 2013-06-21 NOTE — ED Notes (Signed)
Pt escorted to discharge window. Verbalized understanding discharge instructions. In no acute distress.   

## 2013-06-21 NOTE — ED Provider Notes (Signed)
Medical screening examination/treatment/procedure(s) were performed by non-physician practitioner and as supervising physician I was immediately available for consultation/collaboration.  Olivia Mackie, MD 06/21/13 [phone]

## 2013-06-21 NOTE — ED Notes (Signed)
Pt c/o R ear pain x 2 weeks, pt completed Amoxicillin for ear infection dx 2 weeks ago.

## 2013-06-22 NOTE — ED Provider Notes (Signed)
Medical screening examination/treatment/procedure(s) were performed by non-physician practitioner and as supervising physician I was immediately available for consultation/collaboration.   William Phyllistine Domingos, MD 06/22/13 2321 

## 2014-07-09 IMAGING — US US ART/VEN ABD/PELV/SCROTUM DOPPLER LTD
1 series · 14 of 25 positions shown · non-contrast
Comparison: 03/13/2010
COMPARISON: 03/03/2010

CLINICAL DATA: Right adnexal pain

TRANSABDOMINAL AND TRANSVAGINAL ULTRASOUND OF PELVIS
TECHNIQUE: Both transabdominal and transvaginal ultrasound
examinations of the pelvis were performed. Transabdominal technique
was performed for global imaging of the pelvis including uterus,
ovaries, adnexal regions, and pelvic cul-de-sac.
It was necessary to proceed with endovaginal exam following the
transabdominal exam to visualize the ovaries.
TECHNIQUE: Color and duplex Doppler ultrasound was utilized to
evaluate blood flow to the ovaries.

[Series 1: us art/ven abd/pelv/scrotum doppler ltd · 0.30mm/px · 14 of 60 slices shown]
[im 1/60]
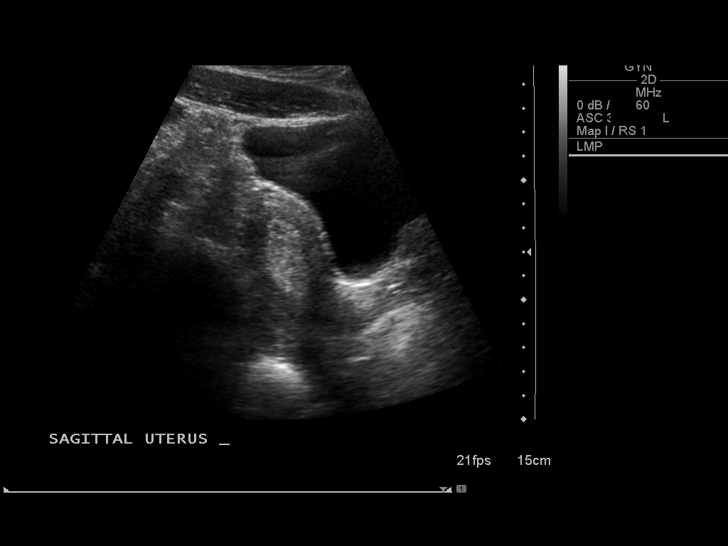
[im 5/60]
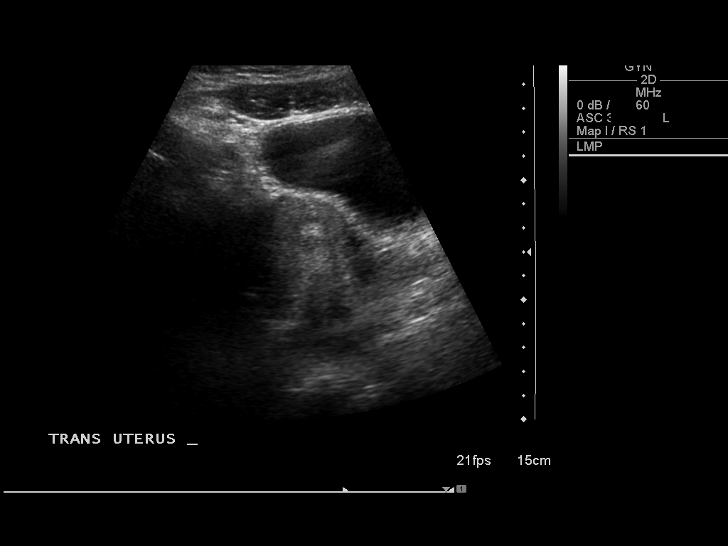
[im 10/60]
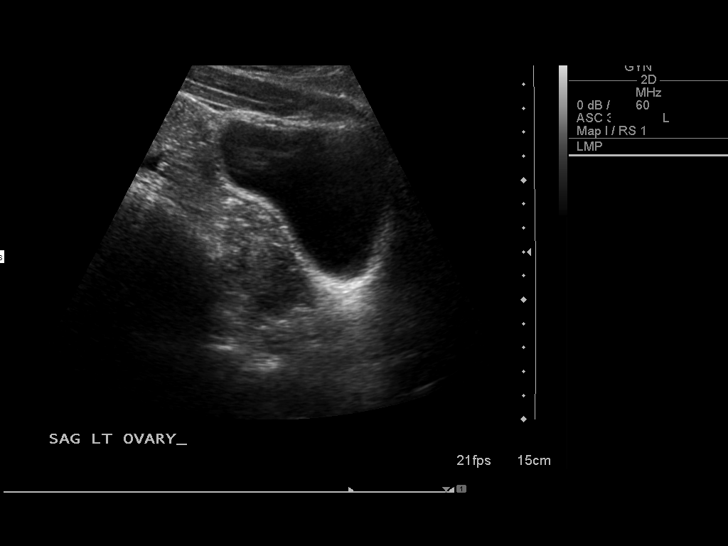
[im 15/60]
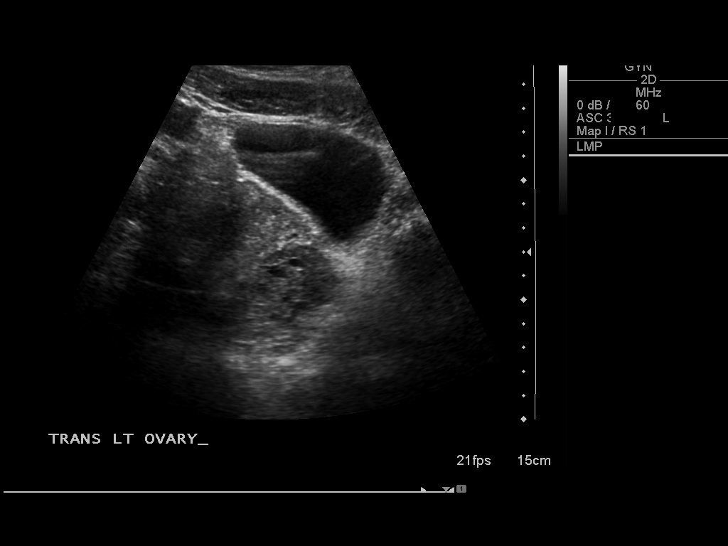
[im 20/60]
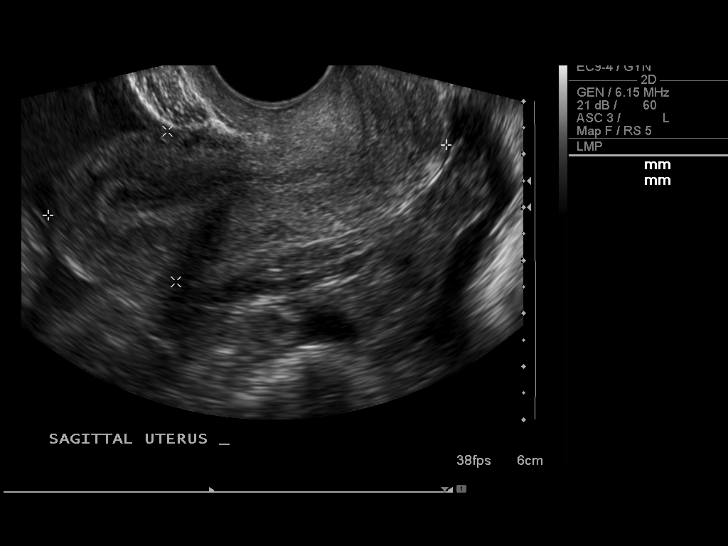
[im 23/60]
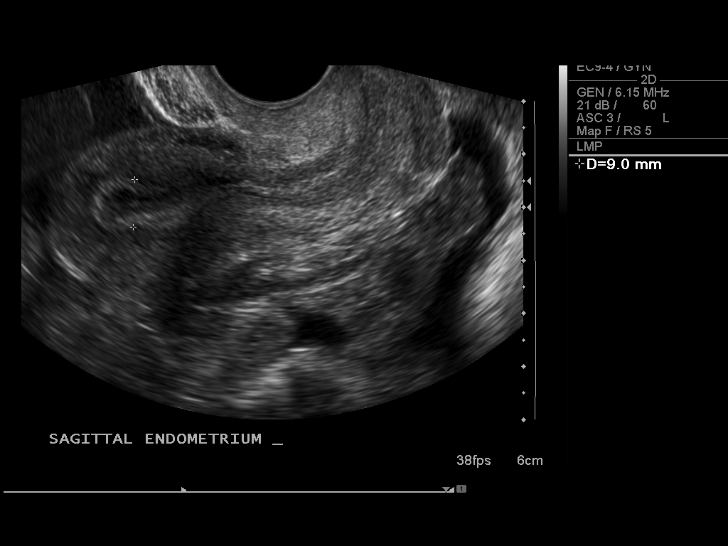
[im 28/60]
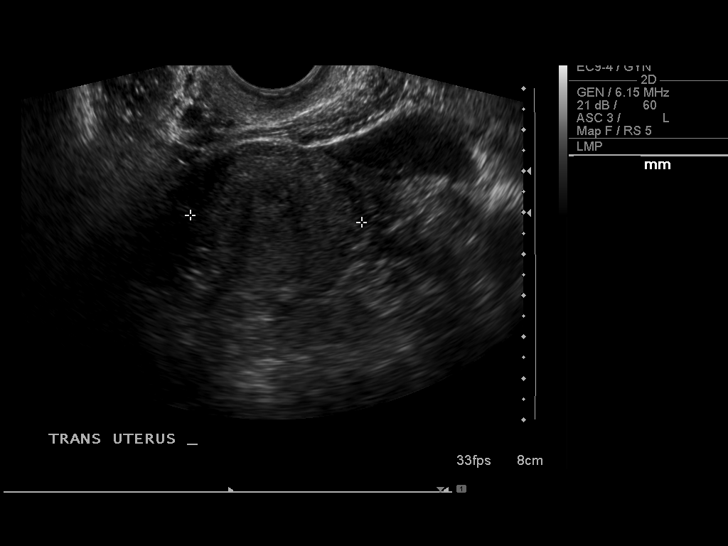
[im 32/60]
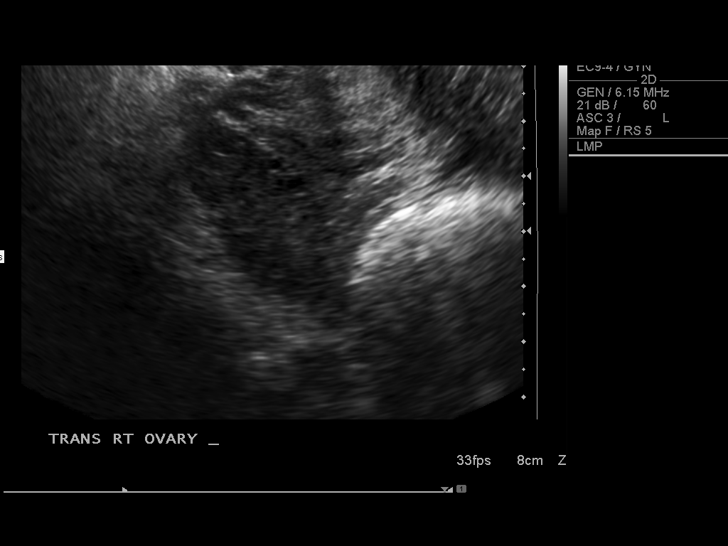
[im 37/60]
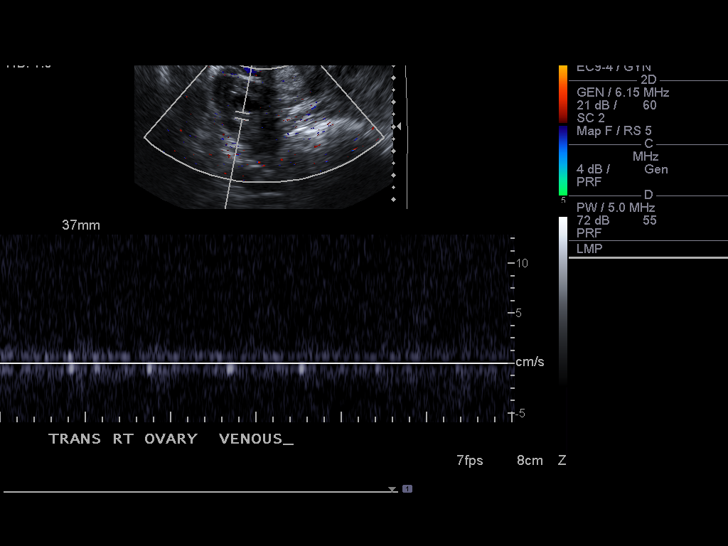
[im 40/60]
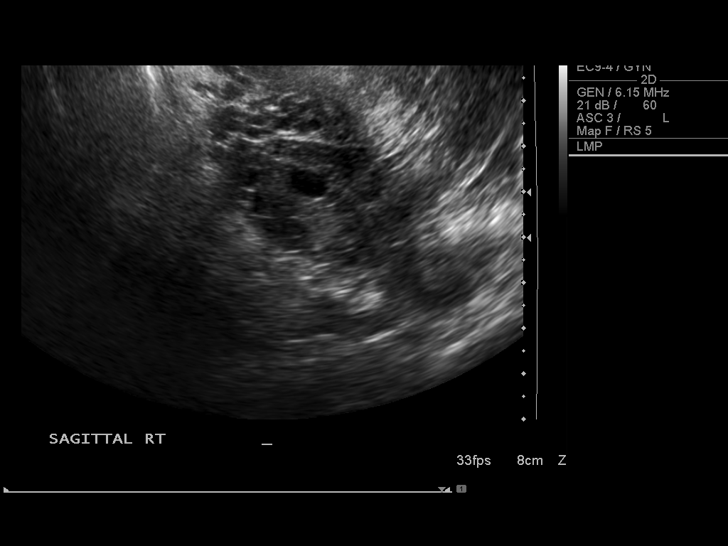
[im 45/60]
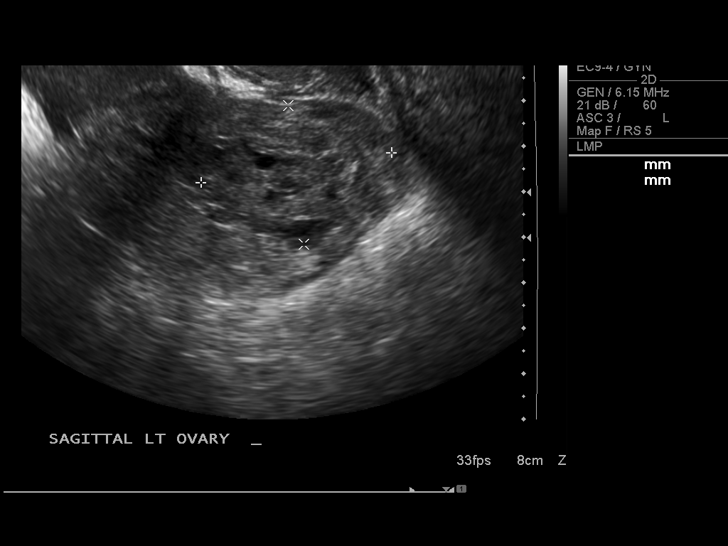
[im 50/60]
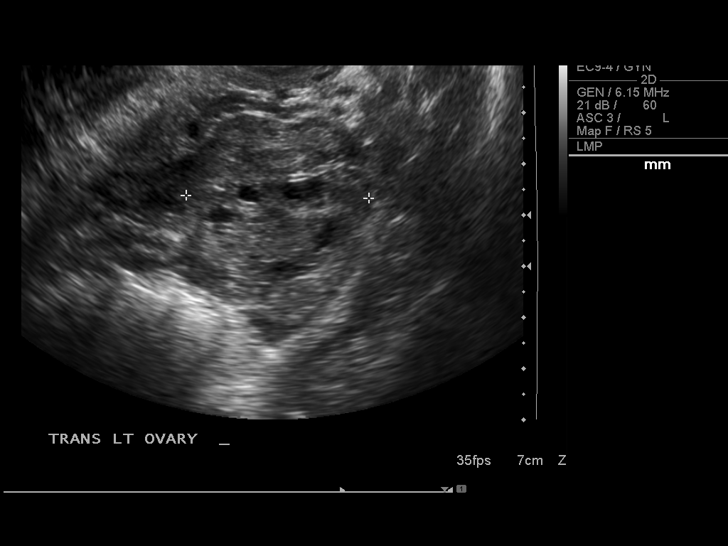
[im 55/60]
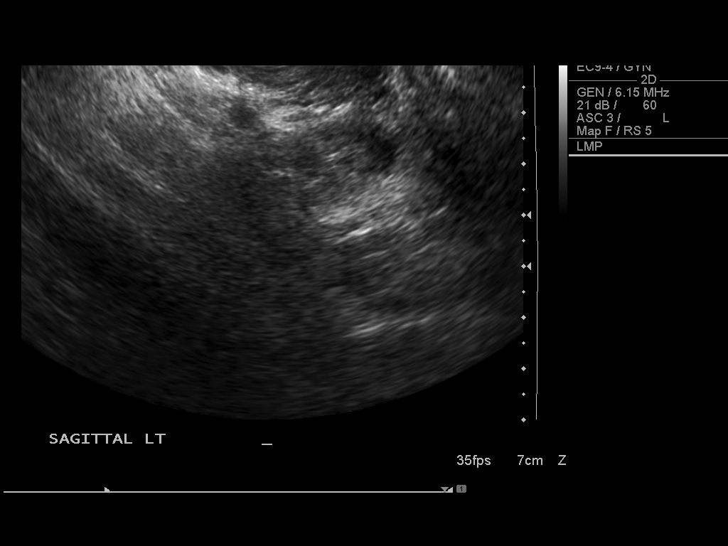
[im 60/60]
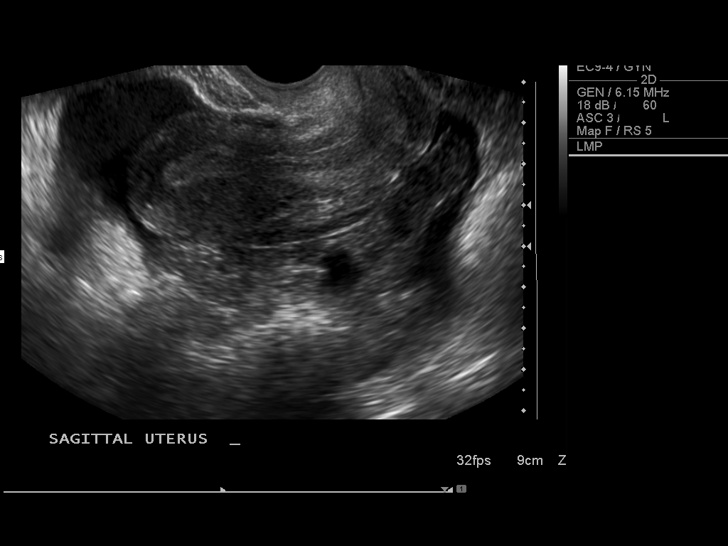

[14 of 25 positions shown; findings below may reference images not displayed]

FINDINGS: Uterus: 29 x 42 x 76 mm, unremarkable.

Endometrium: 9 mm in thickness, unremarkable

Right ovary:  23 x 25 x 42 mm, without focal lesion.

Left ovary: 31 x 36 x 42 mm, unremarkable.

Other findings: There is a small amount of free fluid.
IMPRESSION: Normal study. No evidence of pelvic mass or other significant
abnormality.

DOPPLER ULTRASOUND OF OVARIES
FINDINGS: Arterial and venous Doppler wave forms and normal color
Doppler signal in bilateral ovaries.
IMPRESSION: Negative

## 2014-07-21 ENCOUNTER — Encounter (HOSPITAL_COMMUNITY): Payer: Self-pay | Admitting: Emergency Medicine

## 2014-07-21 ENCOUNTER — Emergency Department (HOSPITAL_COMMUNITY)
Admission: EM | Admit: 2014-07-21 | Discharge: 2014-07-21 | Disposition: A | Discharge status: Home / Self Care | Payer: BC Managed Care – PPO | Attending: Emergency Medicine | Admitting: Emergency Medicine

## 2014-07-21 DIAGNOSIS — J45909 Unspecified asthma, uncomplicated: Secondary | ICD-10-CM | POA: Diagnosis not present

## 2014-07-21 DIAGNOSIS — Z79899 Other long term (current) drug therapy: Secondary | ICD-10-CM | POA: Diagnosis not present

## 2014-07-21 DIAGNOSIS — J029 Acute pharyngitis, unspecified: Secondary | ICD-10-CM | POA: Insufficient documentation

## 2014-07-21 DIAGNOSIS — H53149 Visual discomfort, unspecified: Secondary | ICD-10-CM | POA: Insufficient documentation

## 2014-07-21 DIAGNOSIS — R51 Headache: Secondary | ICD-10-CM | POA: Diagnosis present

## 2014-07-21 DIAGNOSIS — Z8619 Personal history of other infectious and parasitic diseases: Secondary | ICD-10-CM | POA: Diagnosis not present

## 2014-07-21 DIAGNOSIS — R519 Headache, unspecified: Secondary | ICD-10-CM

## 2014-07-21 MED ORDER — DIPHENHYDRAMINE HCL 50 MG/ML IJ SOLN: 25.0000 mg | Freq: Once | INTRAMUSCULAR | Status: DC

## 2014-07-21 MED ORDER — PROCHLORPERAZINE EDISYLATE 5 MG/ML IJ SOLN
10.0000 mg | Freq: Once | INTRAMUSCULAR | Status: AC
  Administered 2014-07-21: 10 mg via INTRAMUSCULAR
  Filled 2014-07-21: qty 2

## 2014-07-21 MED ORDER — IBUPROFEN 800 MG PO TABS
800.0000 mg | ORAL_TABLET | Freq: Once | ORAL | Status: AC
  Administered 2014-07-21: 800 mg via ORAL
  Filled 2014-07-21: qty 1

## 2014-07-21 MED ORDER — DIPHENHYDRAMINE HCL 25 MG PO CAPS
25.0000 mg | ORAL_CAPSULE | Freq: Once | ORAL | Status: AC
  Administered 2014-07-21: 25 mg via ORAL
  Filled 2014-07-21: qty 1

## 2014-07-21 NOTE — ED Notes (Signed)
Advised to follow up with PCP. No other complaints/concerns. AVS explained in detail.

## 2014-07-21 NOTE — ED Notes (Addendum)
Pt from home c/o headache since last pm. She reports taking Nyquil and BC powder last night but nothing today. She reports her pain is all over hear head and is sharp and throbbing in characteristic. She denies nausea or vomiting. She denies nausea or vomiting.

## 2014-07-21 NOTE — Discharge Instructions (Signed)
You are having a headache. No specific cause was found today for your headache. It may have been a migraine or other cause of headache. Stress, anxiety, fatigue, and depression are common triggers for headaches. Your headache today does not appear to be life-threatening or require hospitalization, but often the exact cause of headaches is not determined in the emergency department. Therefore, follow-up with your doctor is very important to find out what may have caused your headache, and whether or not you need any further diagnostic testing or treatment. Sometimes headaches can appear benign (not harmful), but then more serious symptoms can develop which should prompt an immediate re-evaluation by your doctor or the emergency department. SEEK MEDICAL ATTENTION IF: You develop possible problems with medications prescribed.  The medications don't resolve your headache, if it recurs , or if you have multiple episodes of vomiting or can't take fluids. You have a change from the usual headache. RETURN IMMEDIATELY IF you develop a sudden, severe headache or confusion, become poorly responsive or faint, develop a fever above 100.38F or problem breathing, have a change in speech, vision, swallowing, or understanding, or develop new weakness, numbness, tingling, incoordination, or have a seizure.  Migraine Headache A migraine headache is an intense, throbbing pain on one or both sides of your head. A migraine can last for 30 minutes to several hours. CAUSES  The exact cause of a migraine headache is not always known. However, a migraine may be caused when nerves in the brain become irritated and release chemicals that cause inflammation. This causes pain. Certain things may also trigger migraines, such as:  Alcohol.  Smoking.  Stress.  Menstruation.  Aged cheeses.  Foods or drinks that contain nitrates, glutamate, aspartame, or tyramine.  Lack of  sleep.  Chocolate.  Caffeine.  Hunger.  Physical exertion.  Fatigue.  Medicines used to treat chest pain (nitroglycerine), birth control pills, estrogen, and some blood pressure medicines. SIGNS AND SYMPTOMS  Pain on one or both sides of your head.  Pulsating or throbbing pain.  Severe pain that prevents daily activities.  Pain that is aggravated by any physical activity.  Nausea, vomiting, or both.  Dizziness.  Pain with exposure to bright lights, loud noises, or activity.  General sensitivity to bright lights, loud noises, or smells. Before you get a migraine, you may get warning signs that a migraine is coming (aura). An aura may include:  Seeing flashing lights.  Seeing bright spots, halos, or zigzag lines.  Having tunnel vision or blurred vision.  Having feelings of numbness or tingling.  Having trouble talking.  Having muscle weakness. DIAGNOSIS  A migraine headache is often diagnosed based on:  Symptoms.  Physical exam.  A CT scan or MRI of your head. These imaging tests cannot diagnose migraines, but they can help rule out other causes of headaches. TREATMENT Medicines may be given for pain and nausea. Medicines can also be given to help prevent recurrent migraines.  HOME CARE INSTRUCTIONS  Only take over-the-counter or prescription medicines for pain or discomfort as directed by your health care provider. The use of long-term narcotics is not recommended.  Lie down in a dark, quiet room when you have a migraine.  Keep a journal to find out what may trigger your migraine headaches. For example, write down:  What you eat and drink.  How much sleep you get.  Any change to your diet or medicines.  Limit alcohol consumption.  Quit smoking if you smoke.  Get 7-9 hours of  sleep, or as recommended by your health care provider.  Limit stress.  Keep lights dim if bright lights bother you and make your migraines worse. SEEK IMMEDIATE MEDICAL  CARE IF:   Your migraine becomes severe.  You have a fever.  You have a stiff neck.  You have vision loss.  You have muscular weakness or loss of muscle control.  You start losing your balance or have trouble walking.  You feel faint or pass out.  You have severe symptoms that are different from your first symptoms. MAKE SURE YOU:   Understand these instructions.  Will watch your condition.  Will get help right away if you are not doing well or get worse. Document Released: 09/11/2005 Document Revised: 01/26/2014 Document Reviewed: 05/19/2013 [facility] Patient Information 2015 [place], [place]. This information is not intended to replace advice given to you by your health care provider. Make sure you discuss any questions you have with your health care provider.  General Headache Without Cause A headache is pain or discomfort felt around the head or neck area. The specific cause of a headache may not be found. There are many causes and types of headaches. A few common ones are:  Tension headaches.  Migraine headaches.  Cluster headaches.  Chronic daily headaches. HOME CARE INSTRUCTIONS   Keep all follow-up appointments with your caregiver or any specialist referral.  Only take over-the-counter or prescription medicines for pain or discomfort as directed by your caregiver.  Lie down in a dark, quiet room when you have a headache.  Keep a headache journal to find out what may trigger your migraine headaches. For example, write down:  What you eat and drink.  How much sleep you get.  Any change to your diet or medicines.  Try massage or other relaxation techniques.  Put ice packs or heat on the head and neck. Use these 3 to 4 times per day for 15 to 20 minutes each time, or as needed.  Limit stress.  Sit up straight, and do not tense your muscles.  Quit smoking if you smoke.  Limit alcohol use.  Decrease the amount of caffeine you drink, or stop drinking  caffeine.  Eat and sleep on a regular schedule.  Get 7 to 9 hours of sleep, or as recommended by your caregiver.  Keep lights dim if bright lights bother you and make your headaches worse. SEEK MEDICAL CARE IF:   You have problems with the medicines you were prescribed.  Your medicines are not working.  You have a change from the usual headache.  You have nausea or vomiting. SEEK IMMEDIATE MEDICAL CARE IF:   Your headache becomes severe.  You have a fever.  You have a stiff neck.  You have loss of vision.  You have muscular weakness or loss of muscle control.  You start losing your balance or have trouble walking.  You feel faint or pass out.  You have severe symptoms that are different from your first symptoms. MAKE SURE YOU:   Understand these instructions.  Will watch your condition.  Will get help right away if you are not doing well or get worse. Document Released: 09/11/2005 Document Revised: 12/04/2011 Document Reviewed: 09/27/2011 [facility] Patient Information 2015 [place], [place]. This information is not intended to replace advice given to you by your health care provider. Make sure you discuss any questions you have with your health care provider.

## 2014-07-21 NOTE — ED Provider Notes (Signed)
CSN: [id number]     Arrival date & time 07/21/14  1431 History  This chart was scribed for non-physician practitioner Arthor CaptainAbigail Jonesha Tsuchiya, PA-C working with Arby BarretteMarcy Pfeiffer, MD by Littie Deedsichard Sun, ED Scribe. This patient was seen in room WTR9/WTR9 and the patient's care was started at 4:23 PM.      Chief Complaint  Patient presents with  . Headache      HPI HPI Comments: Maria Greer is a 23 y.o. female who presents to the Emergency Department complaining of gradual onset, constant, sharp, gradually worsening HA that began last night. She states that her head feels like it will explode. She describes the pain as a 6/10 currently, and she reports some improvement upon medications administered in the ED during this visit. Patient also notes having photophobia and sore throat. She thought she was catching a cold and has taken some Nyquil.  She denies medical problems and hx of HA, but her mother has hx of migraines. Patient denies changes in vision, tenderness in her face, congestion, cough, fever, nausea, and vomiting. Patient's cousin drove her today. Patient is able to ambulate and swallow fine. Patient had an exam today and did go to class today; she goes to [employer]. Her LNMP was 1 week ago and she denies pregnancy and being on BCP.   Past Medical History  Diagnosis Date  . Asthma   . Yeast vaginitis   . Trichomonas    History reviewed. No pertinent past surgical history. Family History  Problem Relation Age of Onset  . Cancer Maternal Grandmother [id number]    Breast   . Diabetes Maternal Grandmother   . Hypertension Mother    History  Substance Use Topics  . Smoking status: Never Smoker   . Smokeless tobacco: Never Used  . Alcohol Use: Yes     Comment: socially   OB History    Gravida Para Term Preterm AB TAB SAB Ectopic Multiple Living   0 0             Review of Systems  Constitutional: Negative for fever.  HENT: Positive for sore throat. Negative for congestion.    Eyes: Positive for photophobia. Negative for visual disturbance.  Respiratory: Negative for cough.   Gastrointestinal: Negative for nausea and vomiting.  Neurological: Positive for headaches.      Allergies  Review of patient's allergies indicates no known allergies.  Home Medications   Prior to Admission medications   Medication Sig Start Date End Date Taking? Authorizing Provider  albuterol (PROVENTIL HFA;VENTOLIN HFA) 108 (90 BASE) MCG/ACT inhaler Inhale 2 puffs into the lungs every 6 (six) hours as needed for wheezing or shortness of breath.   Yes Historical Provider, MD   BP 90/66 mmHg  Pulse 66  Temp(Src) 98.1 F (36.7 C) (Oral)  Resp 16  SpO2 100%  LMP 06/25/2014 Physical Exam  Nursing note and vitals reviewed. Constitutional: She is oriented to person, place, and time. She appears well-developed and well-nourished. No distress.  HENT:  Head: Normocephalic and atraumatic.  Mouth/Throat: Oropharynx is clear and moist. No oropharyngeal exudate.  Eyes: Pupils are equal, round, and reactive to light.  Neck: Neck supple.  Cardiovascular: Normal rate, regular rhythm and normal heart sounds.   No murmur heard. Pulmonary/Chest: Effort normal and breath sounds normal. No respiratory distress. She has no wheezes. She has no rales.  Musculoskeletal: She exhibits no edema.  Neurological: She is alert and oriented to person, place, and time. No cranial nerve deficit.  Skin: Skin is warm and dry. No rash noted.  Psychiatric: She has a normal mood and affect. Her behavior is normal.    ED Course  Procedures  DIAGNOSTIC STUDIES: Oxygen Saturation is 100% on room air, normal by my interpretation.    COORDINATION OF CARE: 4:29 PM-Discussed treatment plan which includes medication with pt at bedside and pt agreed to plan.    Labs Review Labs Reviewed - No data to display  Imaging Review No results found.   EKG Interpretation None      MDM   Final diagnoses:  Bad  headache    Pt HA treated and improved while in ED.  Presentation is like pts typical HA and non concerning for [facility]AH, ICH, Meningitis, or temporal arteritis. Pt is afebrile with no focal neuro deficits, nuchal rigidity, or change in vision. Pt is to follow up with PCP to discuss prophylactic medication. Pt verbalizes understanding and is agreeable with plan to dc.    I personally performed the services described in this documentation, which was scribed in my presence. The recorded information has been reviewed and is accurate.    Arthor CaptainAbigail Lauraine Crespo, PA-C 07/26/14 1945

## 2016-01-26 DIAGNOSIS — Z113 Encounter for screening for infections with a predominantly sexual mode of transmission: Secondary | ICD-10-CM | POA: Diagnosis not present

## 2016-02-15 DIAGNOSIS — Z23 Encounter for immunization: Secondary | ICD-10-CM | POA: Diagnosis not present

## 2016-05-05 DIAGNOSIS — J069 Acute upper respiratory infection, unspecified: Secondary | ICD-10-CM | POA: Diagnosis not present

## 2016-05-05 DIAGNOSIS — J45909 Unspecified asthma, uncomplicated: Secondary | ICD-10-CM | POA: Diagnosis not present

## 2016-06-08 ENCOUNTER — Other Ambulatory Visit: Payer: Self-pay | Admitting: *Deleted

## 2016-06-08 ENCOUNTER — Ambulatory Visit (INDEPENDENT_AMBULATORY_CARE_PROVIDER_SITE_OTHER): Payer: BLUE CROSS/BLUE SHIELD

## 2016-06-08 ENCOUNTER — Encounter: Payer: Self-pay | Admitting: Podiatry

## 2016-06-08 ENCOUNTER — Ambulatory Visit (INDEPENDENT_AMBULATORY_CARE_PROVIDER_SITE_OTHER): Payer: BLUE CROSS/BLUE SHIELD | Admitting: Podiatry

## 2016-06-08 DIAGNOSIS — M76821 Posterior tibial tendinitis, right leg: Secondary | ICD-10-CM

## 2016-06-08 DIAGNOSIS — M779 Enthesopathy, unspecified: Secondary | ICD-10-CM | POA: Diagnosis not present

## 2016-06-08 DIAGNOSIS — M79671 Pain in right foot: Secondary | ICD-10-CM | POA: Diagnosis not present

## 2016-06-08 MED ORDER — MELOXICAM 15 MG PO TABS: 15.0000 mg | ORAL_TABLET | Freq: Every day | ORAL | 3 refills | Status: DC

## 2016-06-08 NOTE — Progress Notes (Signed)
   Subjective:    Patient ID: Maria Greer, female    DOB: 12/07/1990, 25 y.o.   MRN: [id number]  HPI: She presents today with a chief complaint of pain to the medial aspect of the right foot. She states it is an aching and throbbing for many years noticing now that is starting to hurt so badly is forcing her to walk on the outside of her foot and causing pain on the lateral aspect and on the top of the foot. She also states that her lower back is starting to hurt and for that she takes Aleve occasionally with minimal improvement.    Review of Systems  Musculoskeletal: Positive for back pain.  All other systems reviewed and are negative.      Objective:   Physical Exam: Vital signs are stable she is alert and oriented 3 in no apparent distress. Pulses are strongly palpable. Neurologic sensorium is intact per Semmes-Weinstein monofilament. Deep tendon reflexes are intact. Strength is 5 over 5 dorsiflexion plantar flexors and inverters everters all intrinsic musculature is intact. Orthopedic evaluation demonstrates all joints distal to the ankle for range of motion without crepitation. Cutaneous evaluation demonstrates supple well-hydrated cutis no erythema edema cellulitis drainage or odor. She has pain on palpation of the posterior tibial tendon with mild pes planus medial aspect of the right foot. There is a large nonpulsatile mass at the terminus of the posterior tibial tendon presumably an accessory navicular. 3 views radiographs taken today in the office demonstrates osseously mature individual with good bone stock and mild pes planus. A large accessory navicular is present. Cutaneous evaluation demonstrates supple well-hydrated cutis no erythema edema cellulitis drainage or odor.        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  Plan: Discussed etiology and pathology conservative versus  surgical therapies. At this point she was scanned for set of orthotics and placed on meloxicam daily 15 mg. If this fails to alleviate her symptoms I feel an MRI would be necessary to evaluate the syndesmosis synchondrosis.

## 2016-06-30 DIAGNOSIS — N898 Other specified noninflammatory disorders of vagina: Secondary | ICD-10-CM | POA: Diagnosis not present

## 2016-06-30 DIAGNOSIS — Z113 Encounter for screening for infections with a predominantly sexual mode of transmission: Secondary | ICD-10-CM | POA: Diagnosis not present

## 2016-07-06 ENCOUNTER — Ambulatory Visit (INDEPENDENT_AMBULATORY_CARE_PROVIDER_SITE_OTHER): Payer: BLUE CROSS/BLUE SHIELD | Admitting: Podiatry

## 2016-07-06 ENCOUNTER — Encounter: Payer: Self-pay | Admitting: Podiatry

## 2016-07-06 DIAGNOSIS — M76821 Posterior tibial tendinitis, right leg: Secondary | ICD-10-CM | POA: Diagnosis not present

## 2016-07-06 NOTE — Progress Notes (Signed)
She presents today to pick up her orthotics. She states that she is still having pain to the posterior tibial tendon of the right foot and leg. She states it is not as sore right now as it usually is because it didn't work last night.  Objective: Vital signs are stable alert and oriented 3 pulses are palpable. Neurologic sensorium is intact. The tendon reflexes are intact. Muscle strength is normal bilateral. She has tenderness on palpation medial aspect of the right ankle along the posterior tibial tendon. There is mild edema in this area.  Assessment: Posterior tibial tendinitis right.  Plan: I encouraged her to continue all conservative therapies including the use of the orthotics at this point. I will follow-up with her in 1 month if she is not improved at that time use of the orthotics and all conservative therapies and MRI will be necessary for surgical intervention.

## 2016-07-06 NOTE — Patient Instructions (Signed)

## 2016-08-08 ENCOUNTER — Encounter (INDEPENDENT_AMBULATORY_CARE_PROVIDER_SITE_OTHER): Payer: BLUE CROSS/BLUE SHIELD | Admitting: Podiatry

## 2016-08-08 NOTE — Progress Notes (Signed)
This encounter was created in error - please disregard.

## 2016-12-14 DIAGNOSIS — Z3040 Encounter for surveillance of contraceptives, unspecified: Secondary | ICD-10-CM | POA: Diagnosis not present

## 2016-12-14 DIAGNOSIS — Z01419 Encounter for gynecological examination (general) (routine) without abnormal findings: Secondary | ICD-10-CM | POA: Diagnosis not present

## 2016-12-14 DIAGNOSIS — Z124 Encounter for screening for malignant neoplasm of cervix: Secondary | ICD-10-CM | POA: Diagnosis not present

## 2016-12-14 DIAGNOSIS — Z113 Encounter for screening for infections with a predominantly sexual mode of transmission: Secondary | ICD-10-CM | POA: Diagnosis not present

## 2017-02-05 DIAGNOSIS — Z0000 Encounter for general adult medical examination without abnormal findings: Secondary | ICD-10-CM | POA: Diagnosis not present

## 2017-04-26 DIAGNOSIS — R300 Dysuria: Secondary | ICD-10-CM | POA: Diagnosis not present

## 2017-11-14 DIAGNOSIS — Z113 Encounter for screening for infections with a predominantly sexual mode of transmission: Secondary | ICD-10-CM | POA: Diagnosis not present

## 2018-01-08 DIAGNOSIS — Z6831 Body mass index (BMI) 31.0-31.9, adult: Secondary | ICD-10-CM | POA: Diagnosis not present

## 2018-01-08 DIAGNOSIS — Z01419 Encounter for gynecological examination (general) (routine) without abnormal findings: Secondary | ICD-10-CM | POA: Diagnosis not present

## 2018-01-08 DIAGNOSIS — Z113 Encounter for screening for infections with a predominantly sexual mode of transmission: Secondary | ICD-10-CM | POA: Diagnosis not present

## 2018-02-27 DIAGNOSIS — R8290 Unspecified abnormal findings in urine: Secondary | ICD-10-CM | POA: Diagnosis not present

## 2019-03-04 DIAGNOSIS — Z01419 Encounter for gynecological examination (general) (routine) without abnormal findings: Secondary | ICD-10-CM | POA: Diagnosis not present

## 2019-03-04 DIAGNOSIS — Z113 Encounter for screening for infections with a predominantly sexual mode of transmission: Secondary | ICD-10-CM | POA: Diagnosis not present

## 2019-03-04 DIAGNOSIS — Z6831 Body mass index (BMI) 31.0-31.9, adult: Secondary | ICD-10-CM | POA: Diagnosis not present

## 2019-03-24 DIAGNOSIS — Z111 Encounter for screening for respiratory tuberculosis: Secondary | ICD-10-CM | POA: Diagnosis not present

## 2019-03-24 DIAGNOSIS — Z13220 Encounter for screening for lipoid disorders: Secondary | ICD-10-CM | POA: Diagnosis not present

## 2019-03-24 DIAGNOSIS — Z0000 Encounter for general adult medical examination without abnormal findings: Secondary | ICD-10-CM | POA: Diagnosis not present

## 2019-06-24 DIAGNOSIS — Z113 Encounter for screening for infections with a predominantly sexual mode of transmission: Secondary | ICD-10-CM | POA: Diagnosis not present

## 2019-06-24 DIAGNOSIS — R1909 Other intra-abdominal and pelvic swelling, mass and lump: Secondary | ICD-10-CM | POA: Diagnosis not present

## 2019-07-02 DIAGNOSIS — N764 Abscess of vulva: Secondary | ICD-10-CM | POA: Diagnosis not present

## 2019-07-04 DIAGNOSIS — N764 Abscess of vulva: Secondary | ICD-10-CM | POA: Diagnosis not present

## 2019-09-14 DIAGNOSIS — Z03818 Encounter for observation for suspected exposure to other biological agents ruled out: Secondary | ICD-10-CM | POA: Diagnosis not present

## 2019-09-28 DIAGNOSIS — Z03818 Encounter for observation for suspected exposure to other biological agents ruled out: Secondary | ICD-10-CM | POA: Diagnosis not present

## 2019-10-06 DIAGNOSIS — N898 Other specified noninflammatory disorders of vagina: Secondary | ICD-10-CM | POA: Diagnosis not present

## 2019-10-06 DIAGNOSIS — Z113 Encounter for screening for infections with a predominantly sexual mode of transmission: Secondary | ICD-10-CM | POA: Diagnosis not present

## 2019-10-08 DIAGNOSIS — Z3201 Encounter for pregnancy test, result positive: Secondary | ICD-10-CM | POA: Diagnosis not present

## 2019-10-14 ENCOUNTER — Other Ambulatory Visit: Payer: Self-pay

## 2019-10-14 ENCOUNTER — Encounter (HOSPITAL_COMMUNITY): Payer: Self-pay | Admitting: Obstetrics and Gynecology

## 2019-10-14 ENCOUNTER — Inpatient Hospital Stay (HOSPITAL_COMMUNITY)
Admission: AD | Admit: 2019-10-14 | Discharge: 2019-10-14 | Disposition: A | Discharge status: Home / Self Care | Payer: BC Managed Care – PPO | Attending: Obstetrics and Gynecology | Admitting: Obstetrics and Gynecology

## 2019-10-14 DIAGNOSIS — Z3A00 Weeks of gestation of pregnancy not specified: Secondary | ICD-10-CM | POA: Insufficient documentation

## 2019-10-14 DIAGNOSIS — O99519 Diseases of the respiratory system complicating pregnancy, unspecified trimester: Secondary | ICD-10-CM | POA: Insufficient documentation

## 2019-10-14 DIAGNOSIS — J45909 Unspecified asthma, uncomplicated: Secondary | ICD-10-CM | POA: Insufficient documentation

## 2019-10-14 DIAGNOSIS — N939 Abnormal uterine and vaginal bleeding, unspecified: Secondary | ICD-10-CM | POA: Diagnosis not present

## 2019-10-14 DIAGNOSIS — R102 Pelvic and perineal pain: Secondary | ICD-10-CM | POA: Diagnosis not present

## 2019-10-14 DIAGNOSIS — O009 Unspecified ectopic pregnancy without intrauterine pregnancy: Secondary | ICD-10-CM | POA: Diagnosis not present

## 2019-10-14 DIAGNOSIS — Z79899 Other long term (current) drug therapy: Secondary | ICD-10-CM | POA: Diagnosis not present

## 2019-10-14 DIAGNOSIS — Z3201 Encounter for pregnancy test, result positive: Secondary | ICD-10-CM | POA: Diagnosis not present

## 2019-10-14 LAB — CBC WITH DIFFERENTIAL/PLATELET
Abs Immature Granulocytes: 0.04 10*3/uL (ref 0.00–0.07)
Basophils Absolute: 0 10*3/uL (ref 0.0–0.1)
Basophils Relative: 0 %
Eosinophils Absolute: 0.1 10*3/uL (ref 0.0–0.5)
Eosinophils Relative: 0 %
HCT: 39 % (ref 36.0–46.0)
Hemoglobin: 13.4 g/dL (ref 12.0–15.0)
Immature Granulocytes: 0 %
Lymphocytes Relative: 25 %
Lymphs Abs: 2.9 10*3/uL (ref 0.7–4.0)
MCH: 32.2 pg (ref 26.0–34.0)
MCHC: 34.4 g/dL (ref 30.0–36.0)
MCV: 93.8 fL (ref 80.0–100.0)
Monocytes Absolute: 0.6 10*3/uL (ref 0.1–1.0)
Monocytes Relative: 5 %
Neutro Abs: 8.2 10*3/uL — ABNORMAL HIGH (ref 1.7–7.7)
Neutrophils Relative %: 70 %
Platelets: 370 10*3/uL (ref 150–400)
RBC: 4.16 MIL/uL (ref 3.87–5.11)
RDW: 11.7 % (ref 11.5–15.5)
WBC: 11.8 10*3/uL — ABNORMAL HIGH (ref 4.0–10.5)
nRBC: 0 % (ref 0.0–0.2)

## 2019-10-14 LAB — COMPREHENSIVE METABOLIC PANEL
ALT: 19 U/L (ref 0–44)
AST: 19 U/L (ref 15–41)
Albumin: 4 g/dL (ref 3.5–5.0)
Alkaline Phosphatase: 47 U/L (ref 38–126)
Anion gap: 9 (ref 5–15)
BUN: 8 mg/dL (ref 6–20)
CO2: 23 mmol/L (ref 22–32)
Calcium: 9.2 mg/dL (ref 8.9–10.3)
Chloride: 102 mmol/L (ref 98–111)
Creatinine, Ser: 0.72 mg/dL (ref 0.44–1.00)
GFR calc Af Amer: >60 mL/min (ref >60)
GFR calc non Af Amer: >60 mL/min (ref >60)
Glucose, Bld: 93 mg/dL (ref 70–99)
Potassium: 3.6 mmol/L (ref 3.5–5.1)
Sodium: 134 mmol/L — ABNORMAL LOW (ref 135–145)
Total Bilirubin: 0.8 mg/dL (ref 0.3–1.2)
Total Protein: 7.7 g/dL (ref 6.5–8.1)

## 2019-10-14 LAB — HCG, QUANTITATIVE, PREGNANCY: hCG, Beta Chain, Quant, S: 11194 m[IU]/mL — ABNORMAL HIGH (ref <5)

## 2019-10-14 MED ORDER — METHOTREXATE FOR ECTOPIC PREGNANCY
50.0000 mg/m2 | Freq: Once | INTRAMUSCULAR | Status: AC
  Administered 2019-10-14: 95 mg via INTRAMUSCULAR
  Filled 2019-10-14: qty 1

## 2019-10-14 NOTE — MAU Note (Signed)
Pt sent from office to receive Methotrexate injection.  Will be followed by [facility].

## 2019-10-14 NOTE — MAU Provider Note (Signed)
Chief Complaint: Methotrexate Injection   SUBJECTIVE HPI: Maria Greer is a 29 y.o. G1P0 at Unknown who presents to MAU sent by Dr Charlesetta Garibaldi 6 weeks with ectopic pregnancy mild pain, vaginal spotting, pt stable, understand ectopic and medication regimen with follow up reviewed.    Past Medical History:  Diagnosis Date  . Asthma   . Trichomonas   . Yeast vaginitis    OB History  Gravida Para Term Preterm AB Living  1 0          SAB TAB Ectopic Multiple Live Births               # Outcome Date GA Lbr Len/2nd Weight Sex Delivery Anes PTL Lv  1 Current            No past surgical history on file. Social History   Socioeconomic History  . Marital status: Single    Spouse name: Not on file  . Number of children: Not on file  . Years of education: Not on file  . Highest education level: Not on file  Occupational History  . Not on file  Tobacco Use  . Smoking status: Never Smoker  . Smokeless tobacco: Never Used  Substance and Sexual Activity  . Alcohol use: Yes    Comment: socially  . Drug use: No  . Sexual activity: Yes    Birth control/protection: Condom  Other Topics Concern  . Not on file  Social History Narrative  . Not on file   Social Determinants of Health   Financial Resource Strain:   . Difficulty of Paying Living Expenses: Not on file  Food Insecurity:   . Worried About Charity fundraiser in the Last Year: Not on file  . Ran Out of Food in the Last Year: Not on file  Transportation Needs:   . Lack of Transportation (Medical): Not on file  . Lack of Transportation (Non-Medical): Not on file  Physical Activity:   . Days of Exercise per Week: Not on file  . Minutes of Exercise per Session: Not on file  Stress:   . Feeling of Stress : Not on file  Social Connections:   . Frequency of Communication with Friends and Family: Not on file  . Frequency of Social Gatherings with Friends and Family: Not on file  . Attends Religious Services: Not on file  .  Active Member of Clubs or Organizations: Not on file  . Attends Archivist Meetings: Not on file  . Marital Status: Not on file  Intimate Partner Violence:   . Fear of Current or Ex-Partner: Not on file  . Emotionally Abused: Not on file  . Physically Abused: Not on file  . Sexually Abused: Not on file   No current facility-administered medications on file prior to encounter.   Current Outpatient Medications on File Prior to Encounter  Medication Sig Dispense Refill  . albuterol (PROVENTIL HFA;VENTOLIN HFA) 108 (90 BASE) MCG/ACT inhaler Inhale 2 puffs into the lungs every 6 (six) hours as needed for wheezing or shortness of breath.    . meloxicam (MOBIC) 15 MG tablet Take 1 tablet (15 mg total) by mouth daily. 30 tablet 3   No Known Allergies  I have reviewed the past Medical Hx, Surgical Hx, Social Hx, Allergies and Medications.   REVIEW OF SYSTEMS All systems reviewed and are negative for acute change except as noted in the HPI.   OBJECTIVE BP 118/82 (BP Location: Right Arm)   Pulse  89   Temp 98.1 F (36.7 C) (Oral)   Resp 20   Ht 5' 4"  (1.626 m)   Wt 82.3 kg   LMP 08/26/2019   BMI 31.15 kg/m    PHYSICAL EXAM Constitutional: Well-developed, well-nourished female in no acute distress.  Cardiovascular: normal rate and rhythm, pulses intact Respiratory: normal rate and effort.  GI: Abd soft, non-tender, non-distended. Pos BS x 4 MS: Extremities nontender, no edema, normal ROM Neurologic: Alert and oriented x 4. No focal deficits GU: Neg CVAT. SPECULUM EXAM: Deffered BIMANUAL: Deffered Psych: normal mood and affect  LAB RESULTS No results found for this or any previous visit (from the past 24 hour(s)).  IMAGING No results found.  MAU Management/MDM: Vitals and nursing notes reviewed Orders Placed This Encounter  Procedures  . hCG, quantitative, pregnancy  . CBC WITH DIFFERENTIAL  . Comprehensive metabolic panel  . Diet - low sodium heart healthy   . Notify physician  . Patient may leave MAU but must stay on hospital grounds.  . Discharge diagnosis  . Increase activity slowly  . Call MD for:  . Call MD for:  temperature >100.4  . Call MD for:  persistant nausea and vomiting  . Call MD for:  severe uncontrolled pain  . Call MD for:  redness, tenderness, or signs of infection (pain, swelling, redness, odor or green/yellow discharge around incision site)  . Call MD for:  difficulty breathing, headache or visual disturbances  . Call MD for:  hives  . Call MD for:  persistant dizziness or light-headedness  . Call MD for:  extreme fatigue  . Discharge patient Discharge disposition: 01-Home or Self Care; Discharge patient date: 10/14/2019    Meds ordered this encounter  Medications  . methotrexate [facility]) chemo injection kit 50 mg/m2    Plan of care reviewed with patient, including labs and tests ordered and medical treatment.  Consult Dr Charlesetta Garibaldi.  Treatments in MAU included MXT cbc, cmp, and follow ups. .   ASSESSMENT 1. Ectopic pregnancy without intrauterine pregnancy, unspecified location     PLAN Discharge home in stable condition. Pt discharged with strict bleedingprecautions. Counseled on return precautions Handout   [facility] Follow up.   Specialty: Obstetrics and Gynecology Why: Please f/u for lab draw Beta on the 21 and 25, as well as appointment with Dr Charlesetta Garibaldi for f/u on the 26 all of [place] information: [address] [address] [phone]          Allergies as of 10/14/2019   No Known Allergies     Medication List    TAKE these medications   albuterol 108 (90 Base) MCG/ACT inhaler Commonly known as: VENTOLIN HFA Inhale 2 puffs into the lungs every 6 (six) hours as needed for wheezing or shortness of breath.   meloxicam 15 MG tablet Commonly known as: MOBIC Take 1 tablet (15 mg total)  by mouth daily.        [place], [place] 10/14/2019, 6:53 PM

## 2019-10-16 ENCOUNTER — Other Ambulatory Visit: Payer: Self-pay

## 2019-10-16 ENCOUNTER — Encounter (HOSPITAL_COMMUNITY): Payer: Self-pay | Admitting: Emergency Medicine

## 2019-10-16 ENCOUNTER — Emergency Department (HOSPITAL_COMMUNITY)
Admission: EM | Admit: 2019-10-16 | Discharge: 2019-10-16 | Disposition: A | Discharge status: Home / Self Care | Payer: BC Managed Care – PPO | Attending: Emergency Medicine | Admitting: Emergency Medicine

## 2019-10-16 ENCOUNTER — Emergency Department (HOSPITAL_COMMUNITY): Payer: BC Managed Care – PPO

## 2019-10-16 DIAGNOSIS — O00109 Unspecified tubal pregnancy without intrauterine pregnancy: Secondary | ICD-10-CM | POA: Insufficient documentation

## 2019-10-16 DIAGNOSIS — O00101 Right tubal pregnancy without intrauterine pregnancy: Secondary | ICD-10-CM | POA: Diagnosis not present

## 2019-10-16 DIAGNOSIS — O009 Unspecified ectopic pregnancy without intrauterine pregnancy: Secondary | ICD-10-CM | POA: Diagnosis not present

## 2019-10-16 DIAGNOSIS — Z3A01 Less than 8 weeks gestation of pregnancy: Secondary | ICD-10-CM | POA: Diagnosis not present

## 2019-10-16 DIAGNOSIS — R1030 Lower abdominal pain, unspecified: Secondary | ICD-10-CM

## 2019-10-16 DIAGNOSIS — Z3A00 Weeks of gestation of pregnancy not specified: Secondary | ICD-10-CM | POA: Diagnosis not present

## 2019-10-16 DIAGNOSIS — R102 Pelvic and perineal pain: Secondary | ICD-10-CM | POA: Insufficient documentation

## 2019-10-16 DIAGNOSIS — O26891 Other specified pregnancy related conditions, first trimester: Secondary | ICD-10-CM | POA: Diagnosis not present

## 2019-10-16 LAB — COMPREHENSIVE METABOLIC PANEL
ALT: 28 U/L (ref 0–44)
AST: 28 U/L (ref 15–41)
Albumin: 3.9 g/dL (ref 3.5–5.0)
Alkaline Phosphatase: 46 U/L (ref 38–126)
Anion gap: 10 (ref 5–15)
BUN: 10 mg/dL (ref 6–20)
CO2: 22 mmol/L (ref 22–32)
Calcium: 8.9 mg/dL (ref 8.9–10.3)
Chloride: 102 mmol/L (ref 98–111)
Creatinine, Ser: 0.73 mg/dL (ref 0.44–1.00)
GFR calc Af Amer: >60 mL/min (ref >60)
GFR calc non Af Amer: >60 mL/min (ref >60)
Glucose, Bld: 90 mg/dL (ref 70–99)
Potassium: 3.7 mmol/L (ref 3.5–5.1)
Sodium: 134 mmol/L — ABNORMAL LOW (ref 135–145)
Total Bilirubin: 0.8 mg/dL (ref 0.3–1.2)
Total Protein: 7 g/dL (ref 6.5–8.1)

## 2019-10-16 LAB — CBC
HCT: 38.5 % (ref 36.0–46.0)
Hemoglobin: 12.9 g/dL (ref 12.0–15.0)
MCH: 32.2 pg (ref 26.0–34.0)
MCHC: 33.5 g/dL (ref 30.0–36.0)
MCV: 96 fL (ref 80.0–100.0)
Platelets: 358 10*3/uL (ref 150–400)
RBC: 4.01 MIL/uL (ref 3.87–5.11)
RDW: 11.7 % (ref 11.5–15.5)
WBC: 11.5 10*3/uL — ABNORMAL HIGH (ref 4.0–10.5)
nRBC: 0 % (ref 0.0–0.2)

## 2019-10-16 LAB — POC OCCULT BLOOD, ED: Fecal Occult Bld: POSITIVE — AB

## 2019-10-16 LAB — LIPASE, BLOOD: Lipase: 28 U/L (ref 11–51)

## 2019-10-16 LAB — I-STAT BETA HCG BLOOD, ED (MC, WL, AP ONLY): I-stat hCG, quantitative: >2000 m[IU]/mL — ABNORMAL HIGH (ref <5)

## 2019-10-16 LAB — HCG, QUANTITATIVE, PREGNANCY: hCG, Beta Chain, Quant, S: 15127 m[IU]/mL — ABNORMAL HIGH (ref <5)

## 2019-10-16 MED ORDER — ACETAMINOPHEN 500 MG PO TABS
1000.0000 mg | ORAL_TABLET | Freq: Once | ORAL | Status: AC
  Administered 2019-10-16: 1000 mg via ORAL
  Filled 2019-10-16: qty 2

## 2019-10-16 MED ORDER — MORPHINE SULFATE (PF) 4 MG/ML IV SOLN
4.0000 mg | Freq: Once | INTRAVENOUS | Status: AC
  Administered 2019-10-16: 4 mg via INTRAMUSCULAR
  Filled 2019-10-16: qty 1

## 2019-10-16 NOTE — Discharge Instructions (Signed)
You can take 1,000 mg Tylenol every 6 hours around the clock for pain.  You will need to have your blood level rechecked on 10/20/2019. Keep appointment as scheduled with Dr. Normand Sloop on 10/21/2019.  Go to the MAU IMMEDIATELY if you have any worsening abdominal pain or any other new symptoms.

## 2019-10-16 NOTE — ED Notes (Signed)
Sandwich and gingerale provided to pt, OK per PA [employer]. Updated pt as to plan, awaiting OB consult. Pt requesting additional pain meds, request communicated to PA [facility]

## 2019-10-16 NOTE — ED Triage Notes (Signed)
Pt states she was at MAU 2 days ago and given a shot for ectopic pregnancy. Endorses lower abd pain that worsened today. MAU sent here for evaluation. Reports blood in stool yesterday but denies any vaginal bleeding.

## 2019-10-16 NOTE — ED Provider Notes (Signed)
[facility] EMERGENCY DEPARTMENT Provider Note   CSN: [id number] Arrival date & time: 10/16/19  4098     History Chief Complaint  Patient presents with  . Abdominal Pain  . Ectopic Pregnancy    Maria Greer is a 29 y.o. female who presents to the ED with complaints of gradual onset, constant, worsening, lower abdominal cramping x 2 days s/p methotrexate injection for ectopic pregnancy.  She reports that she was seen by her PCP on 1/19 and had a positive pregnancy test.  She was having some vaginal spotting at that time and an ultrasound was done which showed concern for ectopic pregnancy.  She was sent directly over to MAU to receive a methotrexate injection and discharged home.  Patient states that since then she has been having abdominal cramping which she expected.  However she does report that yesterday while having a bowel movement she noticed bright red blood in the toilet bowl and upon wiping which concerned her.  Patient states that she has been having some brown discharge from her vaginal area but does not believe the bright red blood is coming from her vagina.  She states that she called over to the MAU and was told to come here for further evaluation.  Has not taken anything for her pain that she was told not to take any NSAIDs after receiving the methotrexate injection.  She denies fever, chills, nausea, vomiting, urinary symptoms, dizziness, lightheadedness, chest pain, shortness of breath, any other associated symptoms.  The history is provided by the patient and medical records.       Past Medical History:  Diagnosis Date  . Asthma   . Trichomonas   . Yeast vaginitis     Patient Active Problem List   Diagnosis Date Noted  . CIN I (cervical intraepithelial neoplasia I) 10/04/2012  . Vaginal yeast infection 09/12/2012  . 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 No pertinent surgical history.   OB History    Gravida  1   Para  0   Term      Preterm      AB      Living        SAB      TAB      Ectopic      Multiple      Live Births              Family History  Problem Relation Age of Onset  . Cancer Maternal Grandmother 31       Breast   . Diabetes Maternal Grandmother   . Hypertension Mother     Social History   Tobacco Use  . Smoking status: Never Smoker  . Smokeless tobacco: Never Used  Substance Use Topics  . Alcohol use: Yes    Comment: socially  . Drug use: No    Home Medications Prior to Admission medications   Not on File    Allergies    Patient has no known allergies.  Review of Systems   Review of Systems  Constitutional: Negative for chills and fever.  HENT: Negative for congestion.   Eyes: Negative for visual disturbance.  Respiratory: Negative for cough and shortness of breath.   Cardiovascular: Negative for chest pain.  Gastrointestinal: Positive for abdominal pain and blood in stool. Negative for constipation, diarrhea, nausea and vomiting.  Genitourinary: Positive for vaginal discharge. Negative for  difficulty urinating.  Musculoskeletal: Negative for myalgias.  Skin: Negative for rash.  Neurological: Negative for dizziness and light-headedness.    Physical Exam Updated Vital Signs BP 120/71 (BP Location: Left Arm)   Pulse 85   Temp 98.8 F (37.1 C) (Oral)   Resp 16   Ht 5\' 4"  (1.626 m)   Wt 82.3 kg   LMP 08/26/2019   SpO2 100%   BMI 31.15 kg/m   Physical Exam Vitals and nursing note reviewed.  Constitutional:      Appearance: She is not ill-appearing or diaphoretic.  HENT:     Head: Normocephalic and atraumatic.  Eyes:     Conjunctiva/sclera: Conjunctivae normal.  Cardiovascular:     Rate and Rhythm: Normal rate and regular rhythm.  Pulmonary:     Effort: Pulmonary effort is normal.     Breath sounds: Normal breath sounds.  Abdominal:     General:  Abdomen is flat.     Palpations: Abdomen is soft.     Tenderness: There is abdominal tenderness in the right lower quadrant, suprapubic area and left lower quadrant. There is no right CVA tenderness, left CVA tenderness, guarding or rebound.  Genitourinary:    Rectum: Guaiac result positive.     Comments: Chaperone present for exam. No rashes, lesions, or tenderness to external genitalia. No erythema, injury, or tenderness to vaginal mucosa. Small amount of light brown discharge from vaginal vault. No adnexal masses, tenderness, or fullness. No CMT, cervical friability, or discharge from cervical os.  Small external hemorrhoid noted; non thrombosed. No obvious blood on gloved finger.    Musculoskeletal:     Cervical back: Neck supple.  Skin:    General: Skin is warm and dry.  Neurological:     Mental Status: She is alert.     ED Results / Procedures / Treatments   Labs (all labs ordered are listed, but only abnormal results are displayed) Labs Reviewed  COMPREHENSIVE METABOLIC PANEL - Abnormal; Notable for the following components:      Result Value   Sodium 134 (*)    All other components within normal limits  CBC - Abnormal; Notable for the following components:   WBC 11.5 (*)    All other components within normal limits  HCG, QUANTITATIVE, PREGNANCY - Abnormal; Notable for the following components:   hCG, Beta Chain, Quant, S 15,127 (*)    All other components within normal limits  I-STAT BETA HCG BLOOD, ED ([facility], [facility], [facility] ONLY) - Abnormal; Notable for the following components:   I-stat hCG, quantitative >2,000.0 (*)    All other components within normal limits  POC OCCULT BLOOD, ED - Abnormal; Notable for the following components:   Fecal Occult Bld POSITIVE (*)    All other components within normal limits  LIPASE, BLOOD  URINALYSIS, ROUTINE W REFLEX MICROSCOPIC    EKG None  Radiology 14/09/2018 OB LESS THAN 14 WEEKS WITH OB TRANSVAGINAL  Result Date: 10/16/2019 CLINICAL  DATA:  Known ectopic pregnancy, 2 days after receiving methotrexate treatment. EXAM: OBSTETRIC <14 WK 10/18/2019 AND TRANSVAGINAL OB US TECHNIQUE: Both transabdominal and transvaginal ultrasound examinations were performed for complete evaluation of the gestation as well as the maternal uterus, adnexal regions, and pelvic cul-de-sac. Transvaginal technique was performed to assess early pregnancy. COMPARISON:  None. FINDINGS: Intrauterine gestational sac: Not visualized Yolk sac:  Not visualized Embryo:  Not visualized Cardiac Activity: Not visualized Subchorionic hemorrhage:  None visualized. Maternal uterus/adnexae: There is a sac like area in the right  adnexa measuring 1.3 cm, concerning for gestational sac. There is no evident yolk sac or fetal pole appreciable within this sac. There is a nearby apparent hemorrhagic corpus luteum measuring 1.7 x 1.2 x 1.1 cm. No other extrauterine pelvic mass. There is mild fluid immediately adjacent to the right ovary. IMPRESSION: 1.There is a sac like area in the right ovary measuring 1.3 cm which, if this sac is a gestational sac, has a gestational age of approximately 6 weeks. No fetal pole or yolk sac seen. Question partial resolution of ectopic gestation secondary to methotrexate therapy versus persistent ectopic gestation without fetal pole seen currently. This finding will warrant close clinical and laboratory surveillance; beta HCG should reach 0 before treatment of ectopic gestation considered complete. Timing of repeat ultrasound in part will depend on beta HCG values going forward. A small amount of fluid immediately adjacent to the right ovary could be indicative of either rupture of corpus luteum or rupture of ectopic gestational sac. 2.  No intrauterine mass or gestation. Electronically Signed   By: Bretta Bang III M.D.   On: 10/16/2019 11:57    Procedures Procedures (including critical care time)  Medications Ordered in ED Medications  morphine 4 MG/ML  injection 4 mg (4 mg Intramuscular Given 10/16/19 1026)  acetaminophen (TYLENOL) tablet 1,000 mg (1,000 mg Oral Given 10/16/19 1540)    ED Course  I have reviewed the triage vital signs and the nursing notes.  Pertinent labs & imaging results that were available during my care of the patient were reviewed by me and considered in my medical decision making (see chart for details).  29 year old female presents the ED today complaining of worsening abdominal pain and pelvic pain status post methotrexate injection 2 days ago for ectopic pregnancy.  Orts that she had bright red blood per rectum yesterday, called [facility] today and they told her to come to the ED for further evaluation.  Arrival to the ED patient is afebrile, nontachycardic and nontachypneic.  She does have tenderness to the suprapubic area.  Rectal exam without obvious blood on gloved finger however fecal occult positive.  Patient does have small external hemorrhoid that is nonthrombosed, suspect that this is where the blood is from.   On pelvic exam patient has some scant brown/bloody discharge in the vault with tenderness.  Given this will obtain ultrasound to ensure there is no tubal rupture.  Will consult OB/GYN prior to discharge.  Screening labs obtained including quantitative pregnancy hCG.  Initial 1 2 days ago 11,000.  BC mildly elevated at 11.5 however compared to previous 2 days ago this is about the same.  No electrolyte abnormalities.  Lipase negative.  Quant hCG 15,000, this is expected 2 days status post methotrexate injection.  Awaiting ultrasound results at this time.   Ultrasound with gestational sac however no yolk sac or fetal pole.  There is some fluid around the right ovary that is reading is either ruptured corpus luteum versus rupture gestational sac.  Will consult OB/GYN.  Discussed case with Dr. Estanislado Pandy who dilated ultrasound and does not believe that patient is having a true rupture.  Her vitals are stable, hemoglobin  stable.  Patient is advised to take Tylenol 1000 mg every 6 hours for pain and to follow-up on the 25th as scheduled for repeat beta hCG quant.  Has an appointment scheduled on the 26 with Dr. Normand Sloop for further evaluation.  She advised if she has any worsening pain she is to go directly to the  MD for further evaluation.  She is in agreement with plan and stable for discharge.  Advised to follow-up with her PCP regarding her hemorrhoid and blood in stool.  Is to return to the ED if any worsening symptoms related to symptomatic anemia.   This note was prepared using Dragon voice recognition software and may include unintentional dictation errors due to the inherent limitations of voice recognition software.  Clinical Course as of Oct 15 1553  Thu Oct 16, 2019  5945 Fecal Occult Blood, POC(!): POSITIVE [MV]    Clinical Course User Index [MV] Tanda Rockers, PA-C   MDM Rules/Calculators/A&P                       Final Clinical Impression(s) / ED Diagnoses Final diagnoses:  Right tubal pregnancy without intrauterine pregnancy  Lower abdominal pain    Rx / DC Orders ED Discharge Orders    None       Discharge Instructions     You can take 1,000 mg Tylenol every 6 hours around the clock for pain.  You will need to have your blood level rechecked on 10/20/2019. Keep appointment as scheduled with Dr. Normand Sloop on 10/21/2019.  Go to the MAU IMMEDIATELY if you have any worsening abdominal pain or any other new symptoms.        Tanda Rockers, PA-C 10/16/19 1555    Blane Ohara, MD 10/18/19 [phone]

## 2019-10-16 NOTE — ED Notes (Signed)
Hooked patient up to the monitor patient is resting with call bell in reach 

## 2019-10-20 DIAGNOSIS — Z8759 Personal history of other complications of pregnancy, childbirth and the puerperium: Secondary | ICD-10-CM | POA: Diagnosis not present

## 2019-10-27 DIAGNOSIS — O039 Complete or unspecified spontaneous abortion without complication: Secondary | ICD-10-CM | POA: Diagnosis not present

## 2019-10-31 ENCOUNTER — Ambulatory Visit (HOSPITAL_COMMUNITY)
Admission: AD | Admit: 2019-10-31 | Discharge: 2019-11-01 | Disposition: A | Discharge status: Home / Self Care | Payer: BC Managed Care – PPO | Attending: Obstetrics & Gynecology | Admitting: Obstetrics & Gynecology

## 2019-10-31 ENCOUNTER — Other Ambulatory Visit: Payer: Self-pay

## 2019-10-31 ENCOUNTER — Inpatient Hospital Stay (HOSPITAL_COMMUNITY): Payer: BC Managed Care – PPO | Admitting: Anesthesiology

## 2019-10-31 ENCOUNTER — Inpatient Hospital Stay (HOSPITAL_COMMUNITY): Payer: BC Managed Care – PPO

## 2019-10-31 ENCOUNTER — Encounter (HOSPITAL_COMMUNITY)
Admission: AD | Disposition: A | Discharge status: Home / Self Care | Payer: Self-pay | Source: Home / Self Care | Attending: Emergency Medicine

## 2019-10-31 ENCOUNTER — Encounter (HOSPITAL_COMMUNITY): Payer: Self-pay | Admitting: *Deleted

## 2019-10-31 DIAGNOSIS — R1031 Right lower quadrant pain: Secondary | ICD-10-CM

## 2019-10-31 DIAGNOSIS — O009 Unspecified ectopic pregnancy without intrauterine pregnancy: Secondary | ICD-10-CM

## 2019-10-31 DIAGNOSIS — O00101 Right tubal pregnancy without intrauterine pregnancy: Secondary | ICD-10-CM | POA: Diagnosis not present

## 2019-10-31 DIAGNOSIS — O26899 Other specified pregnancy related conditions, unspecified trimester: Secondary | ICD-10-CM

## 2019-10-31 DIAGNOSIS — Z20822 Contact with and (suspected) exposure to covid-19: Secondary | ICD-10-CM | POA: Diagnosis not present

## 2019-10-31 DIAGNOSIS — R102 Pelvic and perineal pain: Secondary | ICD-10-CM

## 2019-10-31 DIAGNOSIS — O0091 Unspecified ectopic pregnancy with intrauterine pregnancy: Secondary | ICD-10-CM | POA: Diagnosis not present

## 2019-10-31 DIAGNOSIS — R52 Pain, unspecified: Secondary | ICD-10-CM | POA: Diagnosis not present

## 2019-10-31 DIAGNOSIS — R1084 Generalized abdominal pain: Secondary | ICD-10-CM | POA: Diagnosis not present

## 2019-10-31 HISTORY — PX: SALPINGECTOMY: SHX328

## 2019-10-31 HISTORY — PX: DIAGNOSTIC LAPAROSCOPY WITH REMOVAL OF ECTOPIC PREGNANCY: SHX6449

## 2019-10-31 LAB — CBC WITH DIFFERENTIAL/PLATELET
Abs Immature Granulocytes: 0.04 10*3/uL (ref 0.00–0.07)
Basophils Absolute: 0 10*3/uL (ref 0.0–0.1)
Basophils Relative: 0 %
Eosinophils Absolute: 0.1 10*3/uL (ref 0.0–0.5)
Eosinophils Relative: 1 %
HCT: 35.4 % — ABNORMAL LOW (ref 36.0–46.0)
Hemoglobin: 12 g/dL (ref 12.0–15.0)
Immature Granulocytes: 0 %
Lymphocytes Relative: 18 %
Lymphs Abs: 2.2 10*3/uL (ref 0.7–4.0)
MCH: 32.3 pg (ref 26.0–34.0)
MCHC: 33.9 g/dL (ref 30.0–36.0)
MCV: 95.2 fL (ref 80.0–100.0)
Monocytes Absolute: 0.6 10*3/uL (ref 0.1–1.0)
Monocytes Relative: 5 %
Neutro Abs: 9.2 10*3/uL — ABNORMAL HIGH (ref 1.7–7.7)
Neutrophils Relative %: 76 %
Platelets: 396 10*3/uL (ref 150–400)
RBC: 3.72 MIL/uL — ABNORMAL LOW (ref 3.87–5.11)
RDW: 11.9 % (ref 11.5–15.5)
WBC: 12.1 10*3/uL — ABNORMAL HIGH (ref 4.0–10.5)
nRBC: 0 % (ref 0.0–0.2)

## 2019-10-31 LAB — COMPREHENSIVE METABOLIC PANEL
ALT: 33 U/L (ref 0–44)
AST: 27 U/L (ref 15–41)
Albumin: 3.9 g/dL (ref 3.5–5.0)
Alkaline Phosphatase: 58 U/L (ref 38–126)
Anion gap: 10 (ref 5–15)
BUN: 8 mg/dL (ref 6–20)
CO2: 23 mmol/L (ref 22–32)
Calcium: 9 mg/dL (ref 8.9–10.3)
Chloride: 104 mmol/L (ref 98–111)
Creatinine, Ser: 0.69 mg/dL (ref 0.44–1.00)
GFR calc Af Amer: >60 mL/min (ref >60)
GFR calc non Af Amer: >60 mL/min (ref >60)
Glucose, Bld: 109 mg/dL — ABNORMAL HIGH (ref 70–99)
Potassium: 3.9 mmol/L (ref 3.5–5.1)
Sodium: 137 mmol/L (ref 135–145)
Total Bilirubin: 0.9 mg/dL (ref 0.3–1.2)
Total Protein: 6.9 g/dL (ref 6.5–8.1)

## 2019-10-31 LAB — ABO/RH: ABO/RH(D): A POS

## 2019-10-31 LAB — HCG, QUANTITATIVE, PREGNANCY: hCG, Beta Chain, Quant, S: 9340 m[IU]/mL — ABNORMAL HIGH (ref <5)

## 2019-10-31 LAB — RESPIRATORY PANEL BY RT PCR (FLU A&B, COVID)
Influenza A by PCR: NEGATIVE
Influenza B by PCR: NEGATIVE
SARS Coronavirus 2 by RT PCR: NEGATIVE

## 2019-10-31 LAB — TYPE AND SCREEN
ABO/RH(D): A POS
Antibody Screen: NEGATIVE

## 2019-10-31 SURGERY — LAPAROSCOPY, WITH ECTOPIC PREGNANCY SURGICAL TREATMENT
Anesthesia: General | Site: Abdomen | Laterality: Right

## 2019-10-31 MED ORDER — SILVER NITRATE-POT NITRATE 75-25 % EX MISC
CUTANEOUS | Status: AC
  Filled 2019-10-31: qty 10

## 2019-10-31 MED ORDER — KETOROLAC TROMETHAMINE 30 MG/ML IJ SOLN
INTRAMUSCULAR | Status: AC
  Filled 2019-10-31: qty 1

## 2019-10-31 MED ORDER — DIPHENHYDRAMINE HCL 50 MG/ML IJ SOLN
INTRAMUSCULAR | Status: DC | PRN
  Administered 2019-10-31: 12.5 mg via INTRAVENOUS

## 2019-10-31 MED ORDER — EPHEDRINE 5 MG/ML INJ
INTRAVENOUS | Status: AC
  Filled 2019-10-31: qty 10

## 2019-10-31 MED ORDER — FENTANYL CITRATE (PF) 250 MCG/5ML IJ SOLN
INTRAMUSCULAR | Status: AC
  Filled 2019-10-31: qty 5

## 2019-10-31 MED ORDER — SUCCINYLCHOLINE CHLORIDE 200 MG/10ML IV SOSY
PREFILLED_SYRINGE | INTRAVENOUS | Status: AC
  Filled 2019-10-31: qty 10

## 2019-10-31 MED ORDER — LIDOCAINE-EPINEPHRINE 1 %-1:100000 IJ SOLN
INTRAMUSCULAR | Status: AC
  Filled 2019-10-31: qty 1

## 2019-10-31 MED ORDER — ROCURONIUM BROMIDE 10 MG/ML (PF) SYRINGE
PREFILLED_SYRINGE | INTRAVENOUS | Status: AC
  Filled 2019-10-31: qty 10

## 2019-10-31 MED ORDER — ONDANSETRON HCL 4 MG/2ML IJ SOLN
INTRAMUSCULAR | Status: DC | PRN
  Administered 2019-10-31: 4 mg via INTRAVENOUS

## 2019-10-31 MED ORDER — FENTANYL CITRATE (PF) 100 MCG/2ML IJ SOLN
25.0000 ug | INTRAMUSCULAR | Status: DC | PRN
  Administered 2019-11-01 (×2): 50 ug via INTRAVENOUS

## 2019-10-31 MED ORDER — LACTATED RINGERS IV SOLN: INTRAVENOUS | Status: DC | PRN

## 2019-10-31 MED ORDER — ALBUMIN HUMAN 5 % IV SOLN: INTRAVENOUS | Status: DC | PRN

## 2019-10-31 MED ORDER — LIDOCAINE HCL (CARDIAC) PF 100 MG/5ML IV SOSY
PREFILLED_SYRINGE | INTRAVENOUS | Status: DC | PRN
  Administered 2019-10-31: 60 mg via INTRATRACHEAL

## 2019-10-31 MED ORDER — SODIUM CHLORIDE 0.9 % IR SOLN
Status: DC | PRN
  Administered 2019-10-31: 3000 mL

## 2019-10-31 MED ORDER — VASOPRESSIN 20 UNIT/ML IV SOLN
INTRAVENOUS | Status: AC
  Filled 2019-10-31: qty 1

## 2019-10-31 MED ORDER — MIDAZOLAM HCL 5 MG/5ML IJ SOLN
INTRAMUSCULAR | Status: DC | PRN
  Administered 2019-10-31: 2 mg via INTRAVENOUS

## 2019-10-31 MED ORDER — SODIUM CHLORIDE (PF) 0.9 % IJ SOLN
INTRAMUSCULAR | Status: AC
  Filled 2019-10-31: qty 50

## 2019-10-31 MED ORDER — MIDAZOLAM HCL 2 MG/2ML IJ SOLN
INTRAMUSCULAR | Status: AC
  Filled 2019-10-31: qty 2

## 2019-10-31 MED ORDER — KETOROLAC TROMETHAMINE 30 MG/ML IJ SOLN
INTRAMUSCULAR | Status: DC | PRN
  Administered 2019-10-31: 30 mg via INTRAVENOUS

## 2019-10-31 MED ORDER — ARTIFICIAL TEARS OPHTHALMIC OINT
TOPICAL_OINTMENT | OPHTHALMIC | Status: AC
  Filled 2019-10-31: qty 3.5

## 2019-10-31 MED ORDER — PROPOFOL 10 MG/ML IV BOLUS
INTRAVENOUS | Status: AC
  Filled 2019-10-31: qty 40

## 2019-10-31 MED ORDER — SUCCINYLCHOLINE CHLORIDE 20 MG/ML IJ SOLN
INTRAMUSCULAR | Status: DC | PRN
  Administered 2019-10-31: 120 mg via INTRAVENOUS

## 2019-10-31 MED ORDER — SODIUM CHLORIDE 0.9 % IV SOLN: INTRAVENOUS | Status: DC

## 2019-10-31 MED ORDER — DIPHENHYDRAMINE HCL 50 MG/ML IJ SOLN
INTRAMUSCULAR | Status: AC
  Filled 2019-10-31: qty 1

## 2019-10-31 MED ORDER — ONDANSETRON HCL 4 MG/2ML IJ SOLN
INTRAMUSCULAR | Status: AC
  Filled 2019-10-31: qty 2

## 2019-10-31 MED ORDER — SODIUM CHLORIDE (PF) 0.9 % IJ SOLN
INTRAMUSCULAR | Status: AC
  Filled 2019-10-31: qty 10

## 2019-10-31 MED ORDER — LIDOCAINE-EPINEPHRINE 1 %-1:100000 IJ SOLN
INTRAMUSCULAR | Status: DC | PRN
  Administered 2019-10-31: 14 mL

## 2019-10-31 MED ORDER — FENTANYL CITRATE (PF) 250 MCG/5ML IJ SOLN
INTRAMUSCULAR | Status: DC | PRN
  Administered 2019-10-31: 50 ug via INTRAVENOUS
  Administered 2019-10-31: 100 ug via INTRAVENOUS
  Administered 2019-10-31 (×2): 50 ug via INTRAVENOUS

## 2019-10-31 MED ORDER — SUGAMMADEX SODIUM 200 MG/2ML IV SOLN
INTRAVENOUS | Status: DC | PRN
  Administered 2019-10-31: 200 mg via INTRAVENOUS

## 2019-10-31 MED ORDER — PHENYLEPHRINE 40 MCG/ML (10ML) SYRINGE FOR IV PUSH (FOR BLOOD PRESSURE SUPPORT)
PREFILLED_SYRINGE | INTRAVENOUS | Status: AC
  Filled 2019-10-31: qty 10

## 2019-10-31 MED ORDER — LIDOCAINE 2% (20 MG/ML) 5 ML SYRINGE
INTRAMUSCULAR | Status: AC
  Filled 2019-10-31: qty 5

## 2019-10-31 MED ORDER — PROPOFOL 10 MG/ML IV BOLUS
INTRAVENOUS | Status: DC | PRN
  Administered 2019-10-31: 170 ug via INTRAVENOUS

## 2019-10-31 MED ORDER — ROCURONIUM BROMIDE 100 MG/10ML IV SOLN
INTRAVENOUS | Status: DC | PRN
  Administered 2019-10-31: 40 mg via INTRAVENOUS

## 2019-10-31 SURGICAL SUPPLY — 33 items
CABLE HIGH FREQUENCY MONO STRZ (ELECTRODE) IMPLANT
DECANTER SPIKE VIAL GLASS SM (MISCELLANEOUS) ×2 IMPLANT
DERMABOND ADVANCED (GAUZE/BANDAGES/DRESSINGS) ×1
DERMABOND ADVANCED .7 DNX12 (GAUZE/BANDAGES/DRESSINGS) ×1 IMPLANT
DRSG OPSITE POSTOP 3X4 (GAUZE/BANDAGES/DRESSINGS) ×2 IMPLANT
DURAPREP 26ML APPLICATOR (WOUND CARE) ×2 IMPLANT
GLOVE BIOGEL PI IND STRL 7.0 (GLOVE) ×2 IMPLANT
GLOVE BIOGEL PI INDICATOR 7.0 (GLOVE) ×2
GLOVE SURG SS PI 6.5 STRL IVOR (GLOVE) ×2 IMPLANT
GOWN STRL REUS W/ TWL LRG LVL3 (GOWN DISPOSABLE) ×3 IMPLANT
GOWN STRL REUS W/TWL LRG LVL3 (GOWN DISPOSABLE) ×3
KIT TURNOVER KIT B (KITS) ×2 IMPLANT
LIGASURE LAP ATLAS 10MM 37CM (INSTRUMENTS) IMPLANT
LIGASURE VESSEL 5MM BLUNT TIP (ELECTROSURGICAL) ×2 IMPLANT
NEEDLE INSUFFLATION 14GA 120MM (NEEDLE) IMPLANT
NS IRRIG 1000ML POUR BTL (IV SOLUTION) ×2 IMPLANT
PACK LAPAROSCOPY BASIN (CUSTOM PROCEDURE TRAY) ×2 IMPLANT
PACK TRENDGUARD 450 HYBRID PRO (MISCELLANEOUS) ×1 IMPLANT
POUCH SPECIMEN RETRIEVAL 10MM (ENDOMECHANICALS) ×2 IMPLANT
PROTECTOR NERVE ULNAR (MISCELLANEOUS) ×4 IMPLANT
SET IRRIG TUBING LAPAROSCOPIC (IRRIGATION / IRRIGATOR) ×2 IMPLANT
SET TUBE SMOKE EVAC HIGH FLOW (TUBING) ×2 IMPLANT
SLEEVE ENDOPATH XCEL 5M (ENDOMECHANICALS) IMPLANT
SOLUTION ELECTROLUBE (MISCELLANEOUS) IMPLANT
SUT MNCRL AB 4-0 PS2 18 (SUTURE) ×2 IMPLANT
SUT VICRYL 0 UR6 27IN ABS (SUTURE) IMPLANT
TOWEL GREEN STERILE FF (TOWEL DISPOSABLE) ×4 IMPLANT
TRAY FOLEY W/BAG SLVR 14FR (SET/KITS/TRAYS/PACK) ×2 IMPLANT
TRENDGUARD 450 HYBRID PRO PACK (MISCELLANEOUS) ×2
TROCAR BALLN 12MMX100 BLUNT (TROCAR) ×2 IMPLANT
TROCAR XCEL NON-BLD 11X100MML (ENDOMECHANICALS) IMPLANT
TROCAR XCEL NON-BLD 5MMX100MML (ENDOMECHANICALS) ×2 IMPLANT
WARMER LAPAROSCOPE (MISCELLANEOUS) ×2 IMPLANT

## 2019-10-31 NOTE — ED Provider Notes (Signed)
MSE was initiated and I personally evaluated the patient and placed orders (if any) at  7:46 PM on October 31, 2019.  The patient appears stable so that the remainder of the MSE may be completed by another provider.  Maria Greer is a 29 y.o. female presents with acute, worsening right lower quadrant abdominal pain onset earlier today.  Patient was diagnosed with a right-sided ectopic pregnancy 3 weeks ago and given a methotrexate injection.  She has had downtrending hCG since that time with a small amount of spotting but has not passed any products of conception.  She reports Tylenol and ibuprofen without adequate pain control.  She reports after a bowel movement today she had intense and severe right lower quadrant Donnell pain that has not improved.  BP 113/66   Pulse 67   Temp 97.7 F (36.5 C) (Oral)   Resp 16   LMP 08/26/2019   SpO2 98%   Breastfeeding Unknown   Face to face Exam:   General: Awake  HEENT: Atraumatic  Resp: Normal effort  Abd: Nondistended  MSK: Moves all extremities without difficulty   7:46 PM Discussed with Heather, MAU APP who accepts patient in transfer.  RLQ abdominal pain     Milta Deiters 10/31/19 1947    Pricilla Loveless, MD 11/04/19 [phone]

## 2019-10-31 NOTE — ED Notes (Signed)
Pt okay to go to MAU per Dahlia Client, [place], report called to MAU.

## 2019-10-31 NOTE — H&P (Signed)
Maria Greer is an 29 y.o. female with a known history of ectopic pregnancy and s/p methotrexate therapy in the office presented with worsening right lower quadrant pain.  Ultrasound findings today shows blood in the pelvis as well as a complex 6 cm right adnexal mass consistent with a ruptured ectopic pregnancy.  Patient to undergo a laparoscopic removal or ectopic pregnancy and right salpingectomy and possible laparotomy for ruptured ectopic pregnancy.    Pertinent Gynecological History: OB History: G2, P0010   Menstrual History:  Patient's last menstrual period was 08/26/2019.    Past Medical History:  Diagnosis Date  . Asthma   . Trichomonas   . Yeast vaginitis     History reviewed. No pertinent surgical history.  Family History  Problem Relation Age of Onset  . Cancer Maternal Grandmother 44       Breast   . Diabetes Maternal Grandmother   . Hypertension Mother     Social History:  reports that she has never smoked. She has never used smokeless tobacco. She reports current alcohol use. She reports that she does not use drugs.  Allergies:  Allergies  Allergen Reactions  . Peanut Oil Itching  . Shellfish Allergy Itching    Medications Prior to Admission  Medication Sig Dispense Refill Last Dose  . acetaminophen (TYLENOL) 500 MG tablet Take 500 mg by mouth every 6 (six) hours as needed.   10/30/2019 at Unknown time  . ibuprofen (ADVIL) 800 MG tablet Take 800 mg by mouth every 8 (eight) hours as needed.   10/31/2019 at Unknown time    Review of Systems  Blood pressure 114/73, pulse 85, temperature (!) 97.2 F (36.2 C), temperature source Oral, resp. rate 18, height 5\' 4"  (1.626 m), weight 81.8 kg, last menstrual period 08/26/2019, SpO2 100 %, unknown if currently breastfeeding.  ROS  Constitutional: Denies fevers/chills Cardiovascular: Denies chest pain or palpitations Pulmonary: Denies coughing or wheezing Gastrointestinal: Denies nausea, vomiting or  diarrhea Genitourinary: Denies unusual vaginal discharge, dysuria, urgency or frequency. With abdominal and pelvic pain, vaginal bleeding. Musculoskeletal: Denies muscle or joint aches and pain.  Neurology: Denies abnormal sensations such as tingling or numbness.   Physical Exam Constitutional: She is oriented to person, place, and time. She appears well-developed and well-nourished.  HENT:  Head: Normocephalic and atraumatic.  Neck: Normal range of motion.  Cardiovascular: Normal rate, regular rhythm and normal heart sounds.   Respiratory: Effort normal and breath sounds normal.  GI: Soft. Bowel sounds are normal. Abdomen is tender to palpation in right lower and upper quadrant, patient with guarding.  Neurological: She is alert and oriented to person, place, and time.  Skin: Skin is warm and dry.  Psychiatric: She has a normal mood and affect. Her behavior is normal.   Results for orders placed or performed during the hospital encounter of 10/31/19 (from the past 24 hour(s))  hCG, quantitative, pregnancy     Status: Abnormal   Collection Time: 10/31/19  8:11 PM  Result Value Ref Range   hCG, Beta Chain, Quant, S 9,340 (H) <5 mIU/mL  CBC with Differential/Platelet     Status: Abnormal   Collection Time: 10/31/19  8:11 PM  Result Value Ref Range   WBC 12.1 (H) 4.0 - 10.5 K/uL   RBC 3.72 (L) 3.87 - 5.11 MIL/uL   Hemoglobin 12.0 12.0 - 15.0 g/dL   HCT 12/29/19 (L) 93.7 - 16.9 %   MCV 95.2 80.0 - 100.0 fL   MCH 32.3 26.0 - 34.0  pg   MCHC 33.9 30.0 - 36.0 g/dL   RDW 11.9 11.5 - 15.5 %   Platelets 396 150 - 400 K/uL   nRBC 0.0 0.0 - 0.2 %   Neutrophils Relative % 76 %   Neutro Abs 9.2 (H) 1.7 - 7.7 K/uL   Lymphocytes Relative 18 %   Lymphs Abs 2.2 0.7 - 4.0 K/uL   Monocytes Relative 5 %   Monocytes Absolute 0.6 0.1 - 1.0 K/uL   Eosinophils Relative 1 %   Eosinophils Absolute 0.1 0.0 - 0.5 K/uL   Basophils Relative 0 %   Basophils Absolute 0.0 0.0 - 0.1 K/uL   Immature Granulocytes  0 %   Abs Immature Granulocytes 0.04 0.00 - 0.07 K/uL  Comprehensive metabolic panel     Status: Abnormal   Collection Time: 10/31/19  8:11 PM  Result Value Ref Range   Sodium 137 135 - 145 mmol/L   Potassium 3.9 3.5 - 5.1 mmol/L   Chloride 104 98 - 111 mmol/L   CO2 23 22 - 32 mmol/L   Glucose, Bld 109 (H) 70 - 99 mg/dL   BUN 8 6 - 20 mg/dL   Creatinine, Ser 0.69 0.44 - 1.00 mg/dL   Calcium 9.0 8.9 - 10.3 mg/dL   Total Protein 6.9 6.5 - 8.1 g/dL   Albumin 3.9 3.5 - 5.0 g/dL   AST 27 15 - 41 U/L   ALT 33 0 - 44 U/L   Alkaline Phosphatase 58 38 - 126 U/L   Total Bilirubin 0.9 0.3 - 1.2 mg/dL   GFR calc non Af Amer >60 >60 mL/min   GFR calc Af Amer >60 >60 mL/min   Anion gap 10 5 - 15    US OB Transvaginal  Addendum Date: 10/31/2019   ADDENDUM REPORT: 10/31/2019 21:00 ADDENDUM: The above results were relayed to nurse practitioner Jorje Guild at approximately 9 p.m. on 10/31/2019. Electronically Signed   By: Constance Holster M.D.   On: 10/31/2019 21:00   Result Date: 10/31/2019 CLINICAL DATA:  Follow-up for known right ectopic. EXAM: OBSTETRIC <14 WK [place] AND TRANSVAGINAL OB US TECHNIQUE: Both transabdominal and transvaginal ultrasound examinations were performed for complete evaluation of the gestation as well as the maternal uterus, adnexal regions, and pelvic cul-de-sac. Transvaginal technique was performed to assess early pregnancy. COMPARISON:  None. FINDINGS: Intrauterine gestational sac: None Maternal uterus/adnexae: There is a large amount of complex free fluid the patient's pelvis. In the patient's pelvis. There is a complex right adnexal tubular structure measuring approximately 6.2 x 1.9 x 2.5 cm. Endometrial stripe measures approximately 11 mm. IMPRESSION: 1. No intrauterine pregnancy. 2. Large volume of complex free fluid in the patient's pelvis concerning for a ruptured ectopic pregnancy. 3. Persistent right adnexal mass concerning for an ectopic pregnancy. Electronically  Signed: By: Constance Holster M.D. On: 10/31/2019 20:50    Assessment/Plan: 29 y/o G2P0010 with ruptured right fallopian tube ectopic pregnancy, failed methotrexate therapy,  - Proceed to the OR for laparoscopic removal or ectopic pregnancy and right salpingectomy and possible laparotomy for ruptured ectopic pregnancy.   This procedure has been fully reviewed with the patient and written informed consent has been obtained. Alinda Dooms, MD.  10/31/2019, 9:47 PM

## 2019-10-31 NOTE — Anesthesia Procedure Notes (Signed)
Procedure Name: Intubation Date/Time: 10/31/2019 10:18 PM Performed by: Claudina Lick, CRNA Pre-anesthesia Checklist: Patient identified, Emergency Drugs available, Suction available, Patient being monitored and Timeout performed Patient Re-evaluated:Patient Re-evaluated prior to induction Oxygen Delivery Method: Circle system utilized Preoxygenation: Pre-oxygenation with 100% oxygen Induction Type: IV induction, Rapid sequence and Cricoid Pressure applied Laryngoscope Size: Miller and 2 Grade View: Grade I Tube type: Oral Tube size: 7.0 mm Number of attempts: 1 Airway Equipment and Method: Stylet Placement Confirmation: ETT inserted through vocal cords under direct vision,  positive ETCO2 and breath sounds checked- equal and bilateral Secured at: 21 cm Tube secured with: Tape Dental Injury: Teeth and Oropharynx as per pre-operative assessment

## 2019-10-31 NOTE — MAU Note (Signed)
Pt sent from [facility] with complaints of lower abdominal pain that started weeks ago. Was treated on 10/14/19 for ectopic pregnancy. Given MTX and hcg levels have been downtrending. Has been taking Tylenol, which helped at first, but not working now and Ibuprofen without relief. Pt states she had a BM and right after that, the pain got much more intense. Had spotting this morning, but no bleeding since.

## 2019-10-31 NOTE — Anesthesia Preprocedure Evaluation (Addendum)
Anesthesia Evaluation  Patient identified by MRN, date of birth, ID band Patient awake    Reviewed: Allergy & Precautions, NPO status , Patient's Chart, lab work & pertinent test results  Airway Mallampati: II  TM Distance: >3 FB Neck ROM: Full    Dental  (+) Teeth Intact, Dental Advisory Given   Pulmonary    breath sounds clear to auscultation       Cardiovascular negative cardio ROS   Rhythm:Regular Rate:Normal     Neuro/Psych    GI/Hepatic Neg liver ROS, History noted CG   Endo/Other  negative endocrine ROS  Renal/GU negative Renal ROS     Musculoskeletal   Abdominal   Peds  Hematology   Anesthesia Other Findings   Reproductive/Obstetrics                            Anesthesia Physical Anesthesia Plan  ASA: III  Anesthesia Plan: General   Post-op Pain Management:    Induction: Intravenous  PONV Risk Score and Plan: 3 and Ondansetron, Dexamethasone and Midazolam  Airway Management Planned: Oral ETT  Additional Equipment:   Intra-op Plan:   Post-operative Plan: Possible Post-op intubation/ventilation  Informed Consent: I have reviewed the patients History and Physical, chart, labs and discussed the procedure including the risks, benefits and alternatives for the proposed anesthesia with the patient or authorized representative who has indicated his/her understanding and acceptance.     Dental advisory given  Plan Discussed with: CRNA, Anesthesiologist and Surgeon  Anesthesia Plan Comments:        Anesthesia Quick Evaluation

## 2019-10-31 NOTE — MAU Provider Note (Signed)
Chief Complaint: Abdominal Pain   First Provider Initiated Contact with Patient 10/31/19 2052     SUBJECTIVE HPI: Maria Greer is a 29 y.o. G2P0 at Unknown who presents to Maternity Admissions reporting abdominal pain. Initially presented [facility] via EMS but was sent here due to pregnancy status. Patient received methotrexate for a right tubal pregnancy on 1/19. Was in the office ([facility]) on Monday and states HCG was down to 9000 (from 15000). Reports sudden onset of RLQ abdominal pain earlier today. Pain does not radiate. Some vaginal spotting.   Location: RLQ Quality: aching Severity: 8/10 on pain scale Duration: 1 day Timing: constant Modifying factors: nothing makes better or worse Associated signs and symptoms: vaginal spotting  Past Medical History:  Diagnosis Date  . Asthma   . Trichomonas   . Yeast vaginitis    OB History  Gravida Para Term Preterm AB Living  2 0     1    SAB TAB Ectopic Multiple Live Births  1            # Outcome Date GA Lbr Len/2nd Weight Sex Delivery Anes PTL Lv  2 SAB 2016          1 Gravida            History reviewed. No pertinent surgical history. Social History   Socioeconomic History  . Marital status: Single    Spouse name: Not on file  . Number of children: Not on file  . Years of education: Not on file  . Highest education level: Not on file  Occupational History  . Not on file  Tobacco Use  . Smoking status: Never Smoker  . Smokeless tobacco: Never Used  Substance and Sexual Activity  . Alcohol use: Yes    Comment: socially  . Drug use: No  . Sexual activity: Yes    Birth control/protection: Condom  Other Topics Concern  . Not on file  Social History Narrative  . Not on file   Social Determinants of Health   Financial Resource Strain:   . Difficulty of Paying Living Expenses: Not on file  Food Insecurity:   . Worried About Programme researcher, broadcasting/film/video in the Last Year: Not on file  . Ran Out of Food in the Last Year: Not on  file  Transportation Needs:   . Lack of Transportation (Medical): Not on file  . Lack of Transportation (Non-Medical): Not on file  Physical Activity:   . Days of Exercise per Week: Not on file  . Minutes of Exercise per Session: Not on file  Stress:   . Feeling of Stress : Not on file  Social Connections:   . Frequency of Communication with Friends and Family: Not on file  . Frequency of Social Gatherings with Friends and Family: Not on file  . Attends Religious Services: Not on file  . Active Member of Clubs or Organizations: Not on file  . Attends Banker Meetings: Not on file  . Marital Status: Not on file  Intimate Partner Violence:   . Fear of Current or Ex-Partner: Not on file  . Emotionally Abused: Not on file  . Physically Abused: Not on file  . Sexually Abused: Not on file   Family History  Problem Relation Age of Onset  . Cancer Maternal Grandmother 28       Breast   . Diabetes Maternal Grandmother   . Hypertension Mother    No current facility-administered medications on file  prior to encounter.   Current Outpatient Medications on File Prior to Encounter  Medication Sig Dispense Refill  . acetaminophen (TYLENOL) 500 MG tablet Take 500 mg by mouth every 6 (six) hours as needed.    Marland Kitchen ibuprofen (ADVIL) 800 MG tablet Take 800 mg by mouth every 8 (eight) hours as needed.     Allergies  Allergen Reactions  . Peanut Oil Itching  . Shellfish Allergy Itching    I have reviewed patient's Past Medical Hx, Surgical Hx, Family Hx, Social Hx, medications and allergies.   Review of Systems  Constitutional: Negative.   Gastrointestinal: Positive for abdominal pain. Negative for constipation, diarrhea, nausea and vomiting.  Genitourinary: Positive for vaginal bleeding.    OBJECTIVE Patient Vitals for the past 24 hrs:  BP Temp Temp src Pulse Resp SpO2 Height Weight  10/31/19 2054 114/73 - - 85 - - - -  10/31/19 2012 (!) 99/52 (!) 97.2 F (36.2 C) Oral 73  18 100 % 5\' 4"  (1.626 m) 81.8 kg  10/31/19 1936 113/66 97.7 F (36.5 C) Oral 67 16 98 % - -   Constitutional: Well-developed, well-nourished female in no acute distress.  Cardiovascular: normal rate & rhythm, no murmur Respiratory: normal rate and effort. Lung sounds clear throughout GI: Significant tenderness throughout RLQ. Abdomen distended but soft. No rebound or guarding. MS: Extremities nontender, no edema, normal ROM Neurologic: Alert and oriented x 4.      LAB RESULTS Results for orders placed or performed during the hospital encounter of 10/31/19 (from the past 24 hour(s))  CBC with Differential/Platelet     Status: Abnormal   Collection Time: 10/31/19  8:11 PM  Result Value Ref Range   WBC 12.1 (H) 4.0 - 10.5 K/uL   RBC 3.72 (L) 3.87 - 5.11 MIL/uL   Hemoglobin 12.0 12.0 - 15.0 g/dL   HCT 12/29/19 (L) 67.6 - 19.5 %   MCV 95.2 80.0 - 100.0 fL   MCH 32.3 26.0 - 34.0 pg   MCHC 33.9 30.0 - 36.0 g/dL   RDW 09.3 26.7 - 12.4 %   Platelets 396 150 - 400 K/uL   nRBC 0.0 0.0 - 0.2 %   Neutrophils Relative % 76 %   Neutro Abs 9.2 (H) 1.7 - 7.7 K/uL   Lymphocytes Relative 18 %   Lymphs Abs 2.2 0.7 - 4.0 K/uL   Monocytes Relative 5 %   Monocytes Absolute 0.6 0.1 - 1.0 K/uL   Eosinophils Relative 1 %   Eosinophils Absolute 0.1 0.0 - 0.5 K/uL   Basophils Relative 0 %   Basophils Absolute 0.0 0.0 - 0.1 K/uL   Immature Granulocytes 0 %   Abs Immature Granulocytes 0.04 0.00 - 0.07 K/uL  Comprehensive metabolic panel     Status: Abnormal   Collection Time: 10/31/19  8:11 PM  Result Value Ref Range   Sodium 137 135 - 145 mmol/L   Potassium 3.9 3.5 - 5.1 mmol/L   Chloride 104 98 - 111 mmol/L   CO2 23 22 - 32 mmol/L   Glucose, Bld 109 (H) 70 - 99 mg/dL   BUN 8 6 - 20 mg/dL   Creatinine, Ser 12/29/19 0.44 - 1.00 mg/dL   Calcium 9.0 8.9 - 9.98 mg/dL   Total Protein 6.9 6.5 - 8.1 g/dL   Albumin 3.9 3.5 - 5.0 g/dL   AST 27 15 - 41 U/L   ALT 33 0 - 44 U/L   Alkaline Phosphatase 58 38 -  126 U/L  Total Bilirubin 0.9 0.3 - 1.2 mg/dL   GFR calc non Af Amer >60 >60 mL/min   GFR calc Af Amer >60 >60 mL/min   Anion gap 10 5 - 15    IMAGING US OB Transvaginal  Addendum Date: 10/31/2019   ADDENDUM REPORT: 10/31/2019 21:00 ADDENDUM: The above results were relayed to nurse practitioner Jorje Guild at approximately 9 p.m. on 10/31/2019. Electronically Signed   By: Constance Holster M.D.   On: 10/31/2019 21:00   Result Date: 10/31/2019 CLINICAL DATA:  Follow-up for known right ectopic. EXAM: OBSTETRIC <14 WK [place] AND TRANSVAGINAL OB US TECHNIQUE: Both transabdominal and transvaginal ultrasound examinations were performed for complete evaluation of the gestation as well as the maternal uterus, adnexal regions, and pelvic cul-de-sac. Transvaginal technique was performed to assess early pregnancy. COMPARISON:  None. FINDINGS: Intrauterine gestational sac: None Maternal uterus/adnexae: There is a large amount of complex free fluid the patient's pelvis. In the patient's pelvis. There is a complex right adnexal tubular structure measuring approximately 6.2 x 1.9 x 2.5 cm. Endometrial stripe measures approximately 11 mm. IMPRESSION: 1. No intrauterine pregnancy. 2. Large volume of complex free fluid in the patient's pelvis concerning for a ruptured ectopic pregnancy. 3. Persistent right adnexal mass concerning for an ectopic pregnancy. Electronically Signed: By: Constance Holster M.D. On: 10/31/2019 20:50    MAU COURSE Orders Placed This Encounter  Procedures  . Respiratory Panel by RT PCR (Flu A&B, Covid) - Nasopharyngeal Swab  . US OB Transvaginal  . hCG, quantitative, pregnancy  . CBC with Differential/Platelet  . Comprehensive metabolic panel  . Diet NPO time specified  . Type and screen   Meds ordered this encounter  Medications  . 0.9 %  sodium chloride infusion    MDM Vital stable. Patient uncomfortable with TTP in RLQ.  Hemoglobin stable  Ultrasound shows large amount of  complex fluid in pelvis. Right adnexal mass measuring 6.2x1.9x2.5  Results reported to Dr. Alesia Richards who will come manage the patient.   IV in place. Patient NPO. T&S ordered ASSESSMENT 1. Ruptured ectopic pregnancy   2. RLQ abdominal pain   3. Pelvic pain in pregnancy   4. Right tubal pregnancy without intrauterine pregnancy     PLAN Plan for OR for management of ruptured ectopic pregnancy Dr. Alesia Richards in route Covid swab pending   Jorje Guild, NP 10/31/2019  9:10 PM

## 2019-10-31 NOTE — ED Triage Notes (Signed)
Pt arrives from home via [facility] with c/o RLQ abdominal that started today. Pain occurred after bowel movement. No nausea or vomiting. Has been having dull aching pain in the abdomen. Ectopic pregnancy with methotrexate approx 2 weeks ago. Light vaginal bleeding this morning. Fentanyl given. IV established in the left hand. 136/80, hr 90, 98% RA, RR 18.

## 2019-11-01 MED ORDER — IBUPROFEN 800 MG PO TABS: 800.0000 mg | ORAL_TABLET | Freq: Three times a day (TID) | ORAL | 0 refills | Status: DC | PRN

## 2019-11-01 MED ORDER — OXYCODONE-ACETAMINOPHEN 5-325 MG PO TABS: 1.0000 | ORAL_TABLET | ORAL | 0 refills | Status: DC | PRN

## 2019-11-01 MED ORDER — FENTANYL CITRATE (PF) 100 MCG/2ML IJ SOLN
INTRAMUSCULAR | Status: AC
  Filled 2019-11-01: qty 2

## 2019-11-01 NOTE — Anesthesia Postprocedure Evaluation (Signed)
Anesthesia Post Note  Patient: Maria Greer  Procedure(s) Performed: DIAGNOSTIC LAPAROSCOPY WITH RIGHT SALPINGECTOMY AND REMOVAL OF ECTOPIC PREGNANCY (Right Abdomen)     Patient location during evaluation: PACU Anesthesia Type: General Level of consciousness: awake Pain management: pain level controlled Vital Signs Assessment: post-procedure vital signs reviewed and stable Respiratory status: spontaneous breathing Cardiovascular status: stable Postop Assessment: no apparent nausea or vomiting Anesthetic complications: no    Last Vitals:  Vitals:   10/31/19 2054 11/01/19 0008  BP: 114/73 110/83  Pulse: 85 (!) 119  Resp:  19  Temp:  36.6 C  SpO2:  100%    Last Pain:  Vitals:   10/31/19 2012  TempSrc: Oral  PainSc:                  Jeronica Stlouis

## 2019-11-01 NOTE — Brief Op Note (Signed)
10/31/2019  12:36 AM  PATIENT:  Maria Greer  28 y.o. female  PRE-OPERATIVE DIAGNOSIS:  RUPTURED ECTOPIC  POST-OPERATIVE DIAGNOSIS:  RUPTURED ECTOPIC PREGNANCY  PROCEDURE:  Procedure(s): DIAGNOSTIC LAPAROSCOPY WITH RIGHT SALPINGECTOMY AND REMOVAL OF ECTOPIC PREGNANCY (Right)  SURGEON:  Surgeon(s) and Role:    * Hoover Browns, MD - Primary  ASSISTANTS: None   ANESTHESIA:   general  EBL:  25 mL   BLOOD ADMINISTERED:none  DRAINS: none   LOCAL MEDICATIONS USED: 1% LIDOCAINE WITH EPINEPHRINE  and Amount: 20 ml  SPECIMEN:  Source of Specimen:  Right fallopian tube with ectopic pregnancy within.   DISPOSITION OF SPECIMEN:  PATHOLOGY  COUNTS:  YES  TOURNIQUET:  * No tourniquets in log *  DICTATION: .Note written in EPIC  PLAN OF CARE: Discharge to home after PACU  PATIENT DISPOSITION:  PACU - hemodynamically stable.   Delay start of Pharmacological VTE agent (>24hrs) due to surgical blood loss or risk of bleeding: not applicable  Dr. Sallye Ober 10/31/2018.

## 2019-11-01 NOTE — Discharge Instructions (Signed)
[name],  1. You may remove the dressings over the incisions in 4 days (Next Wednesday) after soaking them wet with water.  2.  After you remove the dressings keep the incisions clean and dry, after showering regularly just pat them dry.  The steri strips will fall off by themselves.   3.  You may have some vaginal spotting for the next few days, your period may also come down as expected.   4.  Call me if with excessive abdominal pain despite medication use, excessive vaginal bleeding, fevers or chills.  5.  No intercourse, douching, tampon use, baths or douching for the next two weeks. 6.  Please call the office to schedule a 2 weeks post op check appointment.  7.  Please call my office with any questions.   8.  Please take iron rich foods such as red meats, greens vegetables such as spinach and beans to replenish the blood lost from the ectopic pregnancy.   Dr. Suanne Marker [facility] Phone [phone].   Extension [phone].    Ectopic Pregnancy  An ectopic pregnancy happens when a fertilized egg grows outside the womb (uterus). The fertilized egg cannot stay alive outside of the womb. This problem often happens in a fallopian tube. It is often caused by damage to the tube. If this problem is found early, you may be treated with medicine that stops the egg from growing. If your tube tears or bursts open (ruptures), you will bleed inside. Often, there is very bad pain in the lower belly. This is an emergency. You will need surgery. Get help right away. Follow these instructions at home: After being treated with medicine or surgery:  Rest and limit your activity for as long as told by your doctor.  Until your doctor says that it is safe: ? Do not lift anything that is heavier than 10 lb (4.5 kg) or the limit that your doctor tells you. ? Avoid exercise and any movement that takes a lot of effort.  To prevent problems when pooping (constipation): ? Eat a healthy diet. This  includes:  Fruits.  Vegetables.  Whole grains. ? Drink 6-8 glasses of water a day. Contact a doctor if: Get help right away if:  You have sudden and very bad pain in your belly.  You have very bad pain in your shoulders or neck.  You have pain that gets worse and is not helped by medicine.  You have: ? A fever or chills. ? Vaginal bleeding. ? Redness or swelling at the site of a surgical cut (incision).  You feel sick to your stomach (nauseous) or you throw up (vomit).  You feel dizzy or weak.  You feel light-headed or you pass out (faint). Summary  An ectopic pregnancy happens when a fertilized egg grows outside the womb (uterus).  If this problem is found early, you may be treated with medicine that stops the egg from growing.  If your tube tears or bursts open (ruptures), you will need surgery. This is an emergency. Get help right away. This information is not intended to replace advice given to you by your health care provider. Make sure you discuss any questions you have with your health care provider. Document Revised: 08/24/2017 Document Reviewed: 10/05/2016 Elsevier Patient Education  2020 [employer].

## 2019-11-01 NOTE — Op Note (Addendum)
Maria Greer DOB: 08-21-1991 MRN: [id number]    PREOP DIAGNOSIS:  1.Ruptured ectopic pregnancy in right fallopian tube.  2. Failed medical therapy of methotrexate.   POSTOP DIAGNOSIS:  1.  Same as above.   PROCEDURES: Laparoscopic right salpingectomy for removal of ruptured ectopic pregnancy.   SURGEON: Dr. Hoover Browns   ASSISTANT: None.   Scrub technician: Magda Paganini.    ANESTHESIA: General ETA.    COMPLICATIONS: None   EBL: 25 mL    FINDINGS: Normal uterus.  Right fallopian tube dilated with ectopic pregnancy, also ruptured towards fimbria end. Normal left ovary and left fallopian tube.  Clotted blood in the pelvis approximately 800 ml.     PROCEDURE:    Informed consent was obtained from the patient to undergo the procedure after discussing the risks benefits and alternatives of the procedure. She was taken to the operating room where anesthesia was administered without difficulty. Both arms were tucked and she was placed in the dorsal lithotomy position. She was prepped abdominally, vaginally and perineum in the usual sterile fashion. Foley catheter was placed in the bladder and Acorn uterine manipulator was placed in the cervix.     Attention was then turned to the abdomen where lidocaine with epinephrine was instilled in the infraumbilical area. The skin was incised with a scalpel and and open entry into the abdomen was made through the subcutaneous fascia and peritoneal layers using Allis clamps hemostats and S retractors for retraction. The fascia was tied with 0 Vicryl at the edges.  The 12 mm Hasson trocar was placed in and gas 20 mL gas instilled into the bulb.  The abdomen was insufflated with gas and abdominal pressure maintained at .  The scope was then placed in and the pelvis was visualized. Two 5 mm ports were placed on the left and right sides of the lower abdomen using the Excel trocar under direct visualization. The above findings were noted.  The right fallopian  tube with ectopic pregnancy was removed with the ligasure.  It was then placed in an endo catch bag and delivered through the umbilical port.  The pelvis was irrigated and suctioned out.  Hemostasis was noted on all the surfaces.  All instruments were then removed under direct visualization.     Umbilical incision was closed using 0 Vicryl  Suture.  The skin was closed with 4-0 Monocryl. The 2 side ports incisions were also closed with 4-0 Monocryl. Steri-strips and Honey comb dressing was placed at umbilical port site while steri-strips were placed on the side ports.  The Foley catheter and the Acorn manipulator were removed.  The patient was then awoken from anesthesia and she was taken to recovery room in stable condition   SPECIMENS:Right fallopian tube with ruptured ectopic pregnancy.   Ventura Hollenbeck Sallye Ober, MD.  DATE: 10/30/2018 Time: 11/30 pm

## 2019-11-01 NOTE — Transfer of Care (Signed)
Immediate Anesthesia Transfer of Care Note  Patient: Maria Greer  Procedure(s) Performed: DIAGNOSTIC LAPAROSCOPY WITH RIGHT SALPINGECTOMY AND REMOVAL OF ECTOPIC PREGNANCY (Right Abdomen)  Patient Location: PACU  Anesthesia Type:General  Level of Consciousness: drowsy  Airway & Oxygen Therapy: Patient Spontanous Breathing and Patient connected to face mask oxygen  Post-op Assessment: Report given to RN and Post -op Vital signs reviewed and stable  Post vital signs: Reviewed and stable  Last Vitals:  Vitals Value Taken Time  BP 110/83 11/01/19 0011  Temp    Pulse    Resp 18 11/01/19 0011  SpO2    Vitals shown include unvalidated device data.  Last Pain:  Vitals:   10/31/19 2012  TempSrc: Oral  PainSc:          Complications: No apparent anesthesia complications

## 2019-11-03 LAB — SURGICAL PATHOLOGY

## 2019-11-14 DIAGNOSIS — L680 Hirsutism: Secondary | ICD-10-CM | POA: Diagnosis not present

## 2019-11-25 DIAGNOSIS — L680 Hirsutism: Secondary | ICD-10-CM | POA: Diagnosis not present

## 2019-12-07 DIAGNOSIS — Z20828 Contact with and (suspected) exposure to other viral communicable diseases: Secondary | ICD-10-CM | POA: Diagnosis not present

## 2020-02-06 DIAGNOSIS — Z111 Encounter for screening for respiratory tuberculosis: Secondary | ICD-10-CM | POA: Diagnosis not present

## 2020-03-23 DIAGNOSIS — R399 Unspecified symptoms and signs involving the genitourinary system: Secondary | ICD-10-CM | POA: Diagnosis not present

## 2020-04-02 DIAGNOSIS — R8290 Unspecified abnormal findings in urine: Secondary | ICD-10-CM | POA: Diagnosis not present

## 2020-04-02 DIAGNOSIS — J45909 Unspecified asthma, uncomplicated: Secondary | ICD-10-CM | POA: Diagnosis not present

## 2020-04-02 DIAGNOSIS — Z13220 Encounter for screening for lipoid disorders: Secondary | ICD-10-CM | POA: Diagnosis not present

## 2020-04-02 DIAGNOSIS — J309 Allergic rhinitis, unspecified: Secondary | ICD-10-CM | POA: Diagnosis not present

## 2020-04-02 DIAGNOSIS — Z0000 Encounter for general adult medical examination without abnormal findings: Secondary | ICD-10-CM | POA: Diagnosis not present

## 2020-04-02 DIAGNOSIS — Z131 Encounter for screening for diabetes mellitus: Secondary | ICD-10-CM | POA: Diagnosis not present

## 2020-04-06 DIAGNOSIS — Z124 Encounter for screening for malignant neoplasm of cervix: Secondary | ICD-10-CM | POA: Diagnosis not present

## 2020-04-06 DIAGNOSIS — Z01419 Encounter for gynecological examination (general) (routine) without abnormal findings: Secondary | ICD-10-CM | POA: Diagnosis not present

## 2020-04-06 DIAGNOSIS — Z113 Encounter for screening for infections with a predominantly sexual mode of transmission: Secondary | ICD-10-CM | POA: Diagnosis not present

## 2020-04-20 DIAGNOSIS — B373 Candidiasis of vulva and vagina: Secondary | ICD-10-CM | POA: Diagnosis not present

## 2020-04-20 DIAGNOSIS — Z113 Encounter for screening for infections with a predominantly sexual mode of transmission: Secondary | ICD-10-CM | POA: Diagnosis not present

## 2020-04-20 DIAGNOSIS — R8290 Unspecified abnormal findings in urine: Secondary | ICD-10-CM | POA: Diagnosis not present

## 2020-04-20 DIAGNOSIS — R309 Painful micturition, unspecified: Secondary | ICD-10-CM | POA: Diagnosis not present

## 2020-04-20 DIAGNOSIS — N898 Other specified noninflammatory disorders of vagina: Secondary | ICD-10-CM | POA: Diagnosis not present

## 2020-04-20 DIAGNOSIS — Z139 Encounter for screening, unspecified: Secondary | ICD-10-CM | POA: Diagnosis not present

## 2020-04-22 DIAGNOSIS — R8290 Unspecified abnormal findings in urine: Secondary | ICD-10-CM | POA: Diagnosis not present

## 2020-12-10 DIAGNOSIS — Z1152 Encounter for screening for COVID-19: Secondary | ICD-10-CM | POA: Diagnosis not present

## 2020-12-10 DIAGNOSIS — U071 COVID-19: Secondary | ICD-10-CM | POA: Diagnosis not present

## 2021-02-15 IMAGING — US US OB < 14 WEEKS - US OB TV
1 series · 13 of 28 positions shown · non-contrast
Comparison: None.

CLINICAL DATA: Known ectopic pregnancy, 2 days after receiving
methotrexate treatment.

EXAM:
OBSTETRIC <14 WK US AND TRANSVAGINAL OB US
TECHNIQUE: Both transabdominal and transvaginal ultrasound examinations were
performed for complete evaluation of the gestation as well as the
maternal uterus, adnexal regions, and pelvic cul-de-sac.
Transvaginal technique was performed to assess early pregnancy.

[Series 1: us ob < 14 weeks - us ob tv · 98 acquisitions, 13 frames shown]
[im 4/98]
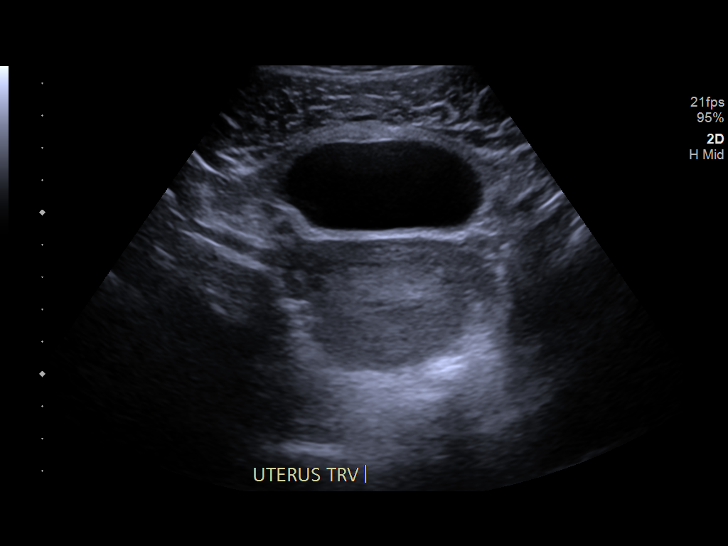
[im 11/98]
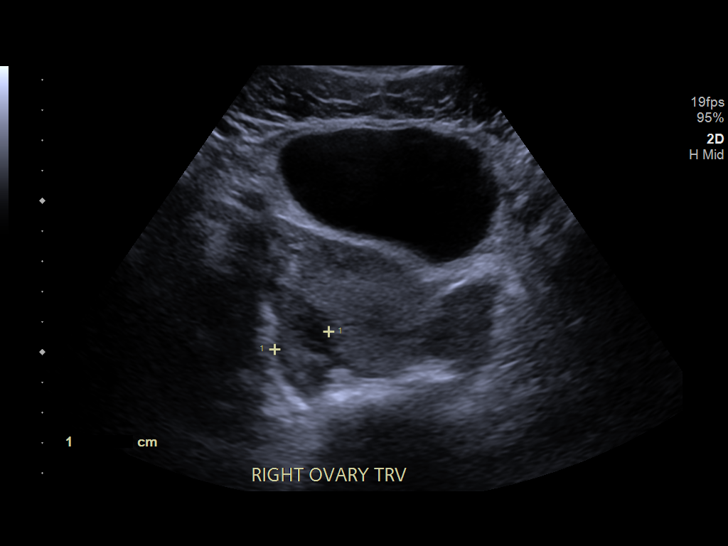
[im 18/98]
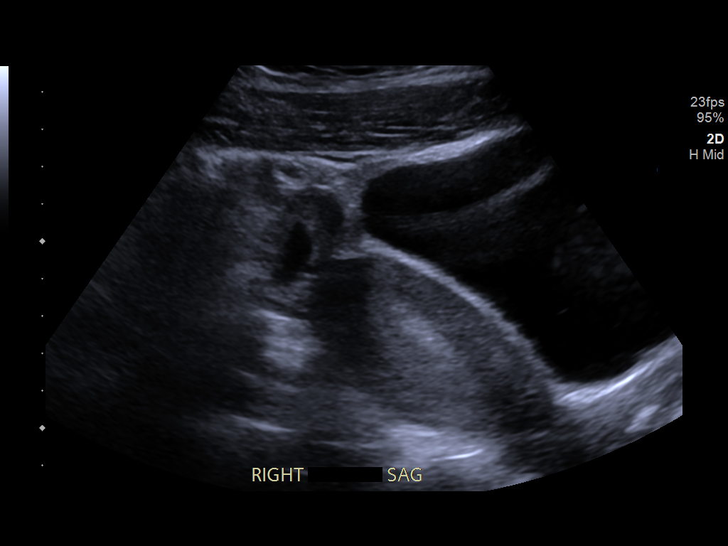
[im 26/98]
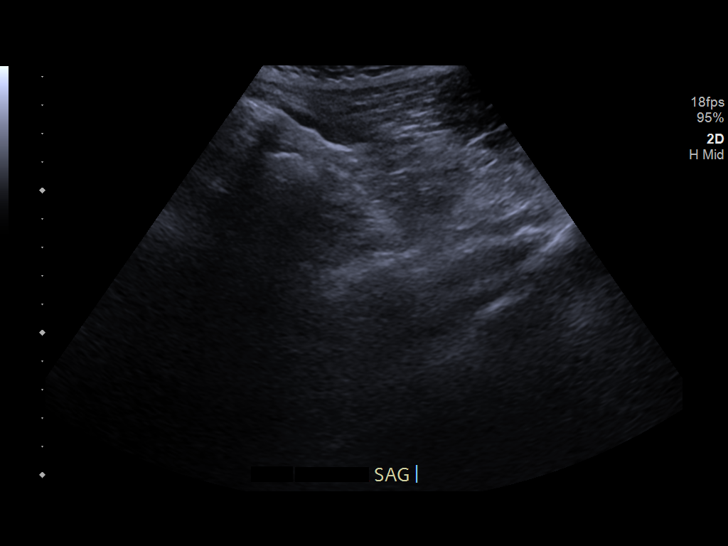
[im 33/98]
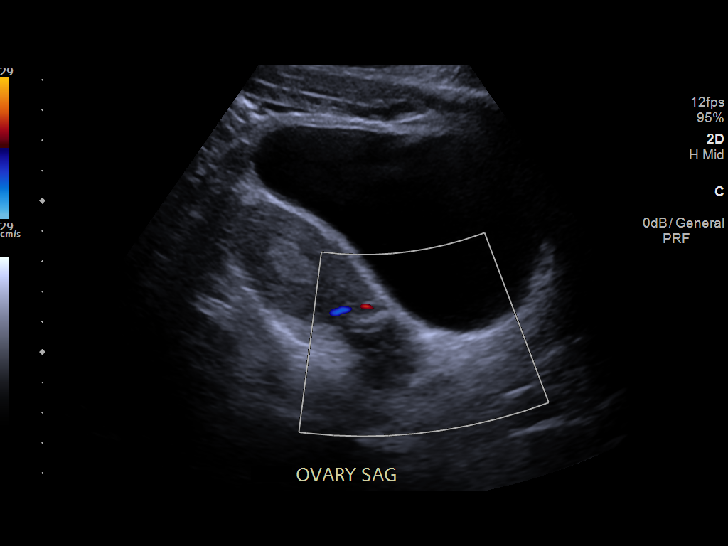
[im 40/98]
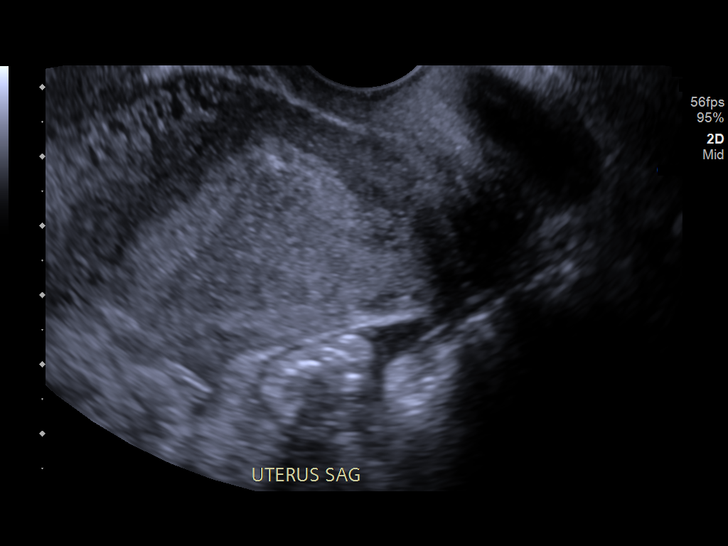
[im 51/98]
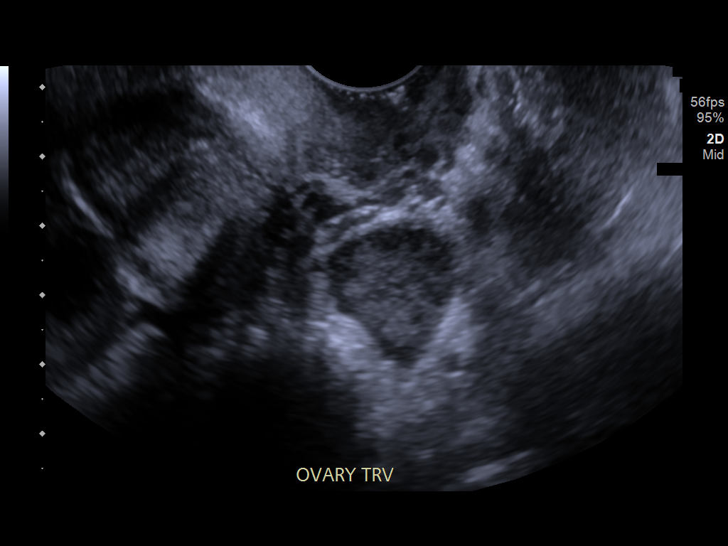
[im 58/98]
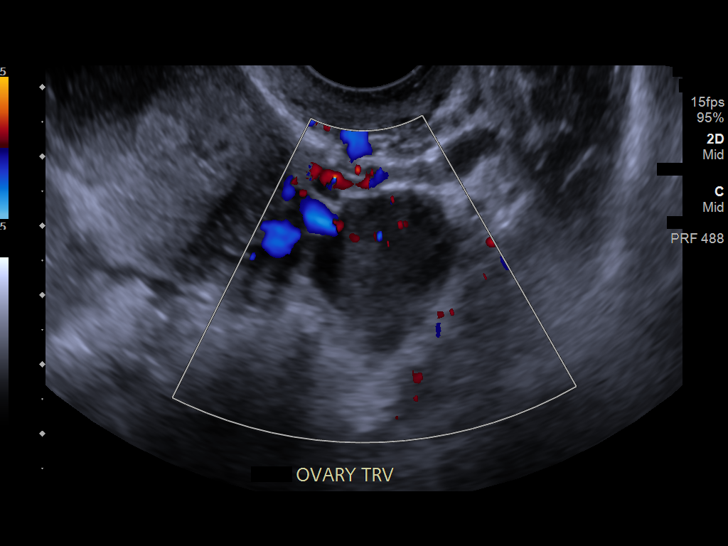
[im 65/98]
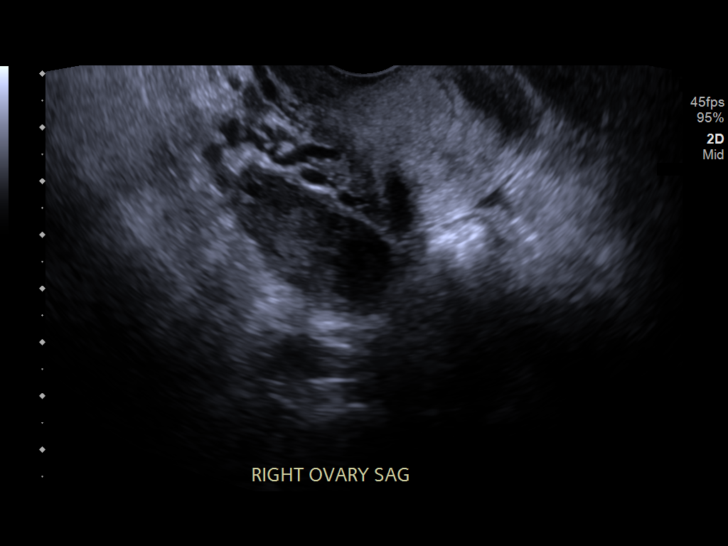
[im 72/98]
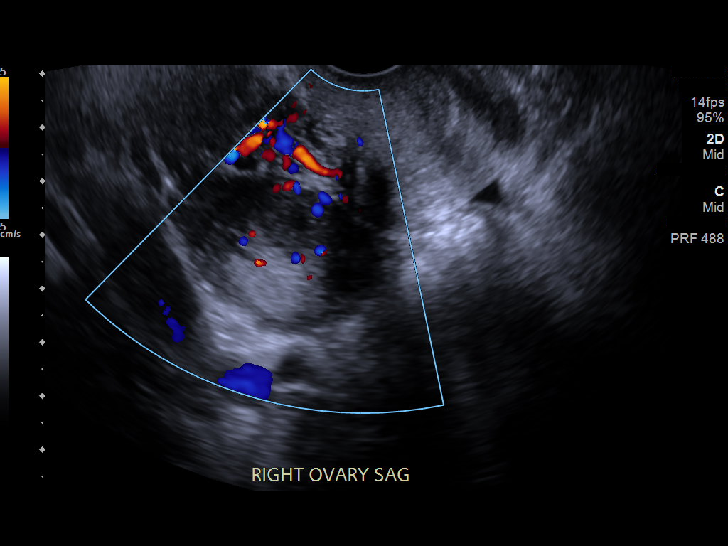
[im 80/98]
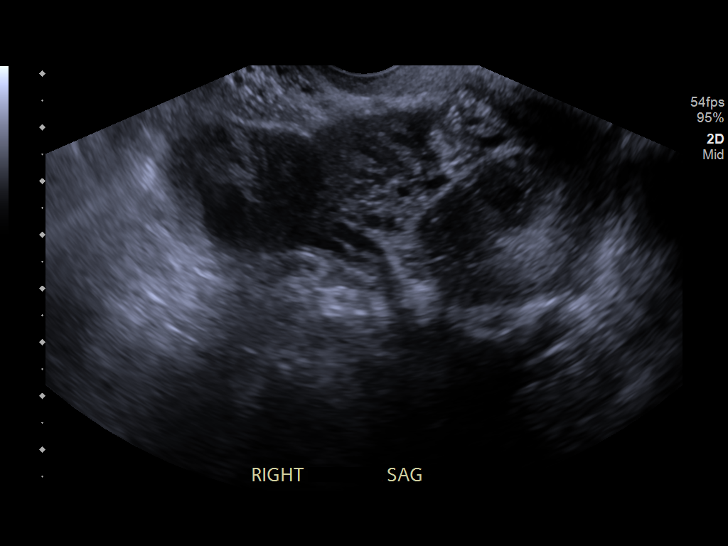
[im 87/98]
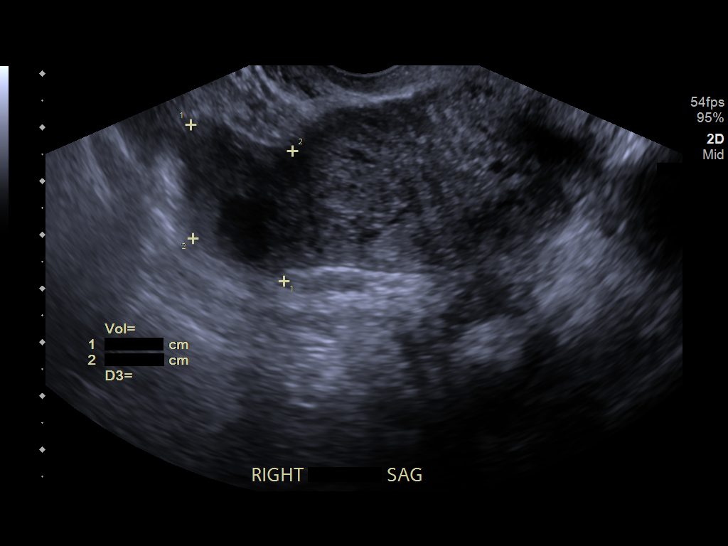
[im 94/98]
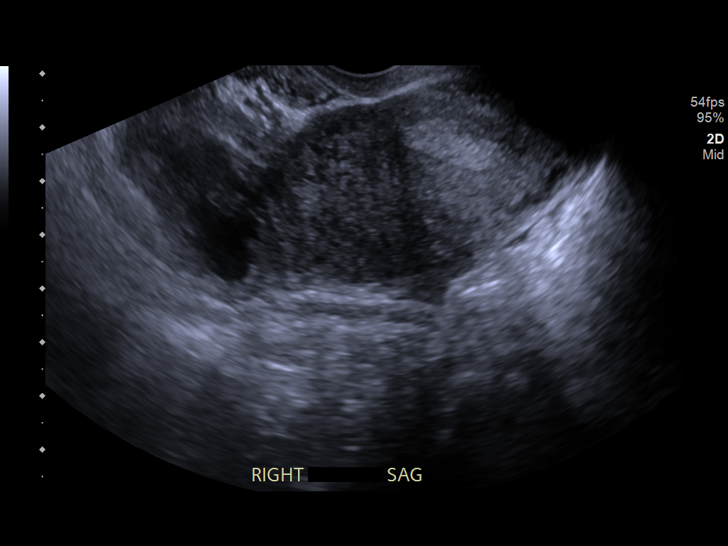

[13 of 28 positions shown; findings below may reference images not displayed]

FINDINGS: Intrauterine gestational sac: Not visualized

Yolk sac:  Not visualized

Embryo:  Not visualized

Cardiac Activity: Not visualized

Subchorionic hemorrhage:  None visualized.

Maternal uterus/adnexae: There is a sac like area in the right
adnexa measuring 1.3 cm, concerning for gestational sac. There is no
evident yolk sac or fetal pole appreciable within this sac. There is
a nearby apparent hemorrhagic corpus luteum measuring 1.7 x 1.2 x
1.1 cm. No other extrauterine pelvic mass. There is mild fluid
immediately adjacent to the right ovary.
IMPRESSION: 1.There is a sac like area in the right ovary measuring 1.3 cm
which, if this sac is a gestational sac, has a gestational age of
approximately 6 weeks. No fetal pole or yolk sac seen. Question
partial resolution of ectopic gestation secondary to methotrexate
therapy versus persistent ectopic gestation without fetal pole seen
currently. This finding will warrant close clinical and laboratory
surveillance; beta HCG should reach 0 before treatment of ectopic
gestation considered complete. Timing of repeat ultrasound in part
will depend on beta HCG values going forward. A small amount of
fluid immediately adjacent to the right ovary could be indicative of
either rupture of corpus luteum or rupture of ectopic gestational
sac.

2.  No intrauterine mass or gestation.

## 2021-02-28 DIAGNOSIS — Z113 Encounter for screening for infections with a predominantly sexual mode of transmission: Secondary | ICD-10-CM | POA: Diagnosis not present

## 2021-03-02 IMAGING — US US OB TRANSVAGINAL
1 series · 15 of 28 positions shown · non-contrast
Comparison: None.
COMPARISON: None.

Addendum:
CLINICAL DATA: Follow-up for known right ectopic.

EXAM:
OBSTETRIC <14 WK US AND TRANSVAGINAL OB US
TECHNIQUE: Both transabdominal and transvaginal ultrasound examinations were
performed for complete evaluation of the gestation as well as the
maternal uterus, adnexal regions, and pelvic cul-de-sac.
Transvaginal technique was performed to assess early pregnancy.

[Series 1: us ob transvaginal · 43 acquisitions, 15 frames shown]
[im 1/43]
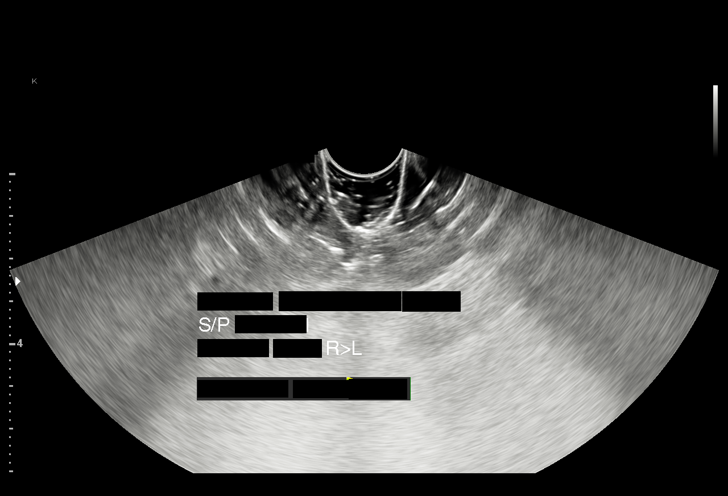
[im 4/43]
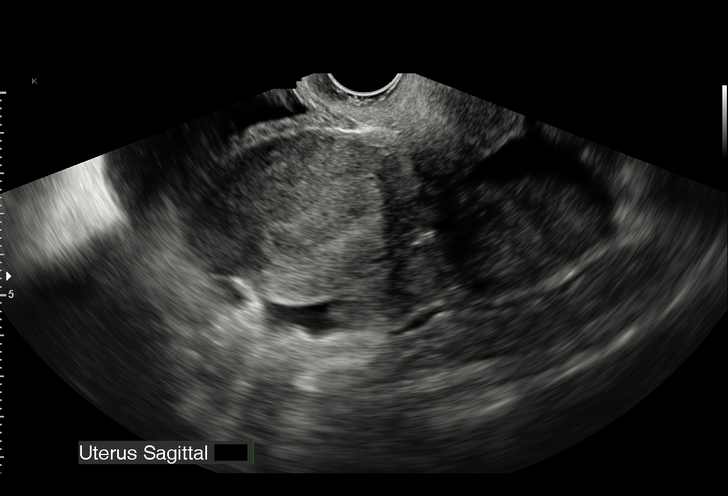
[im 7/43]
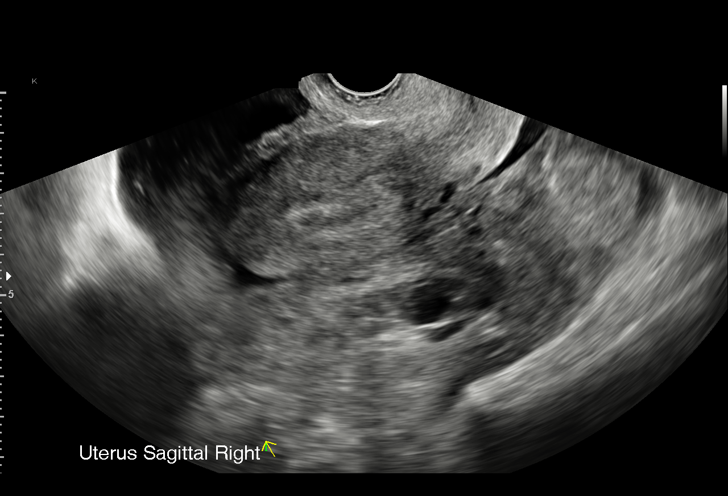
[im 10/43]
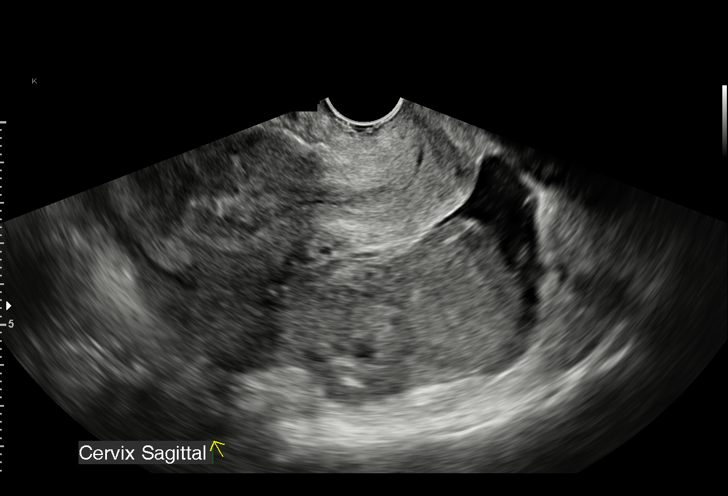
[im 13/43]
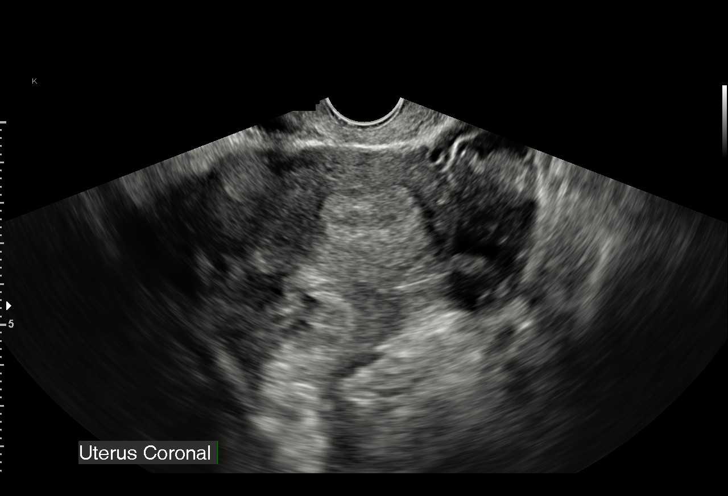
[im 16/43]
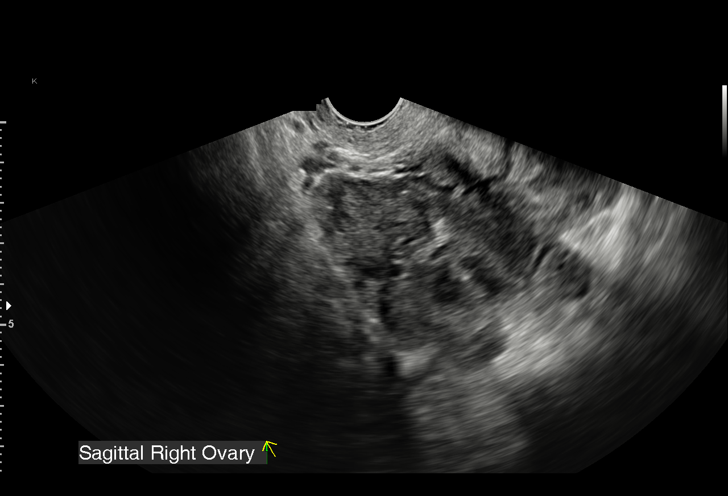
[im 19/43]
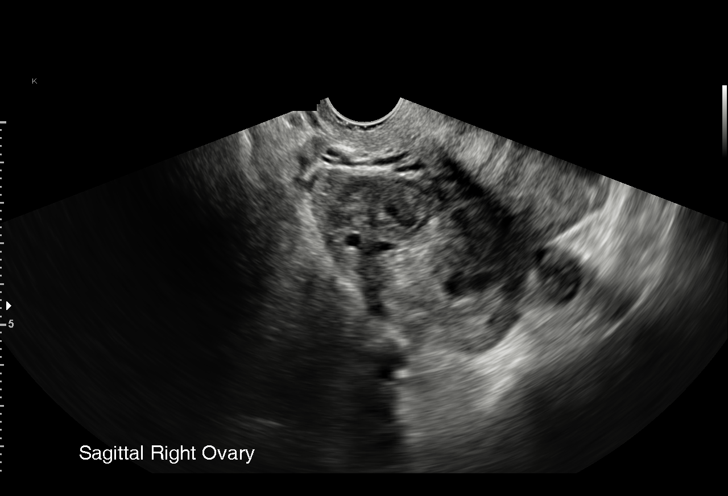
[im 22/43]
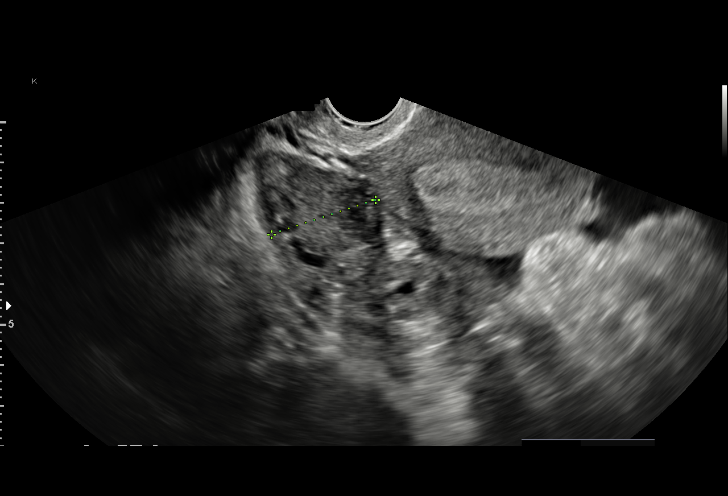
[im 24/43]
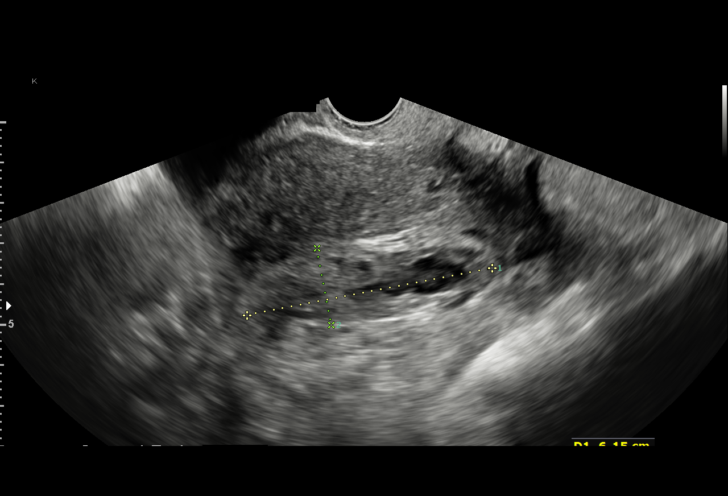
[im 27/43]
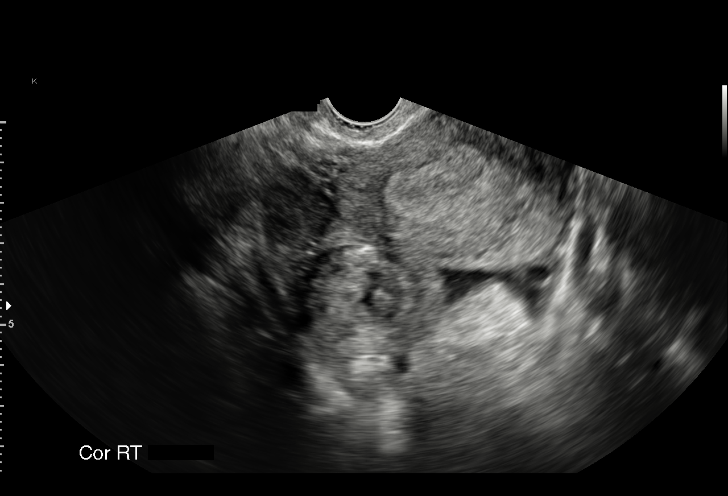
[im 30/43]
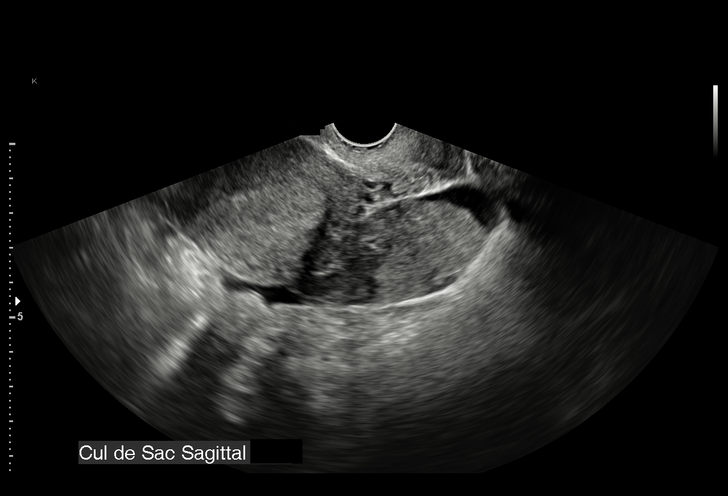
[im 33/43]
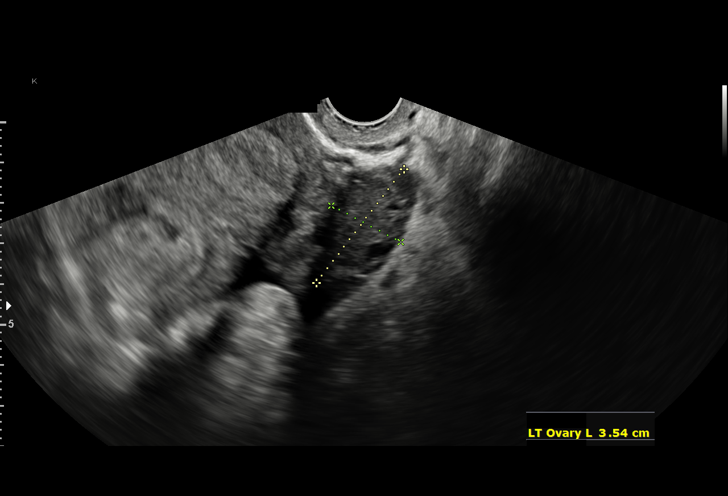
[im 36/43]
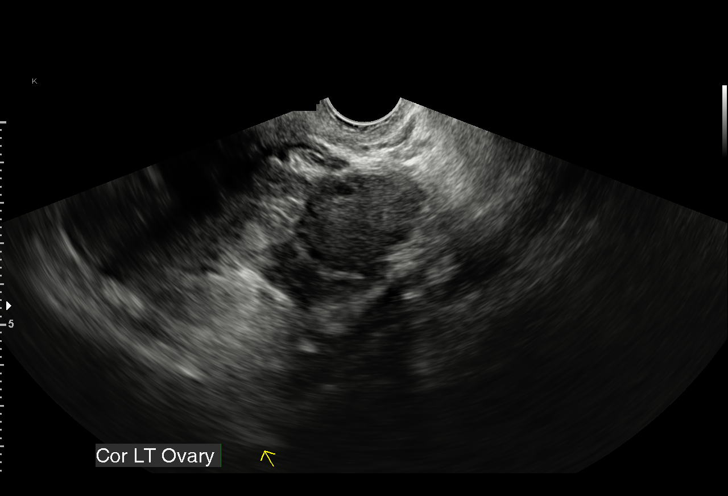
[im 39/43]
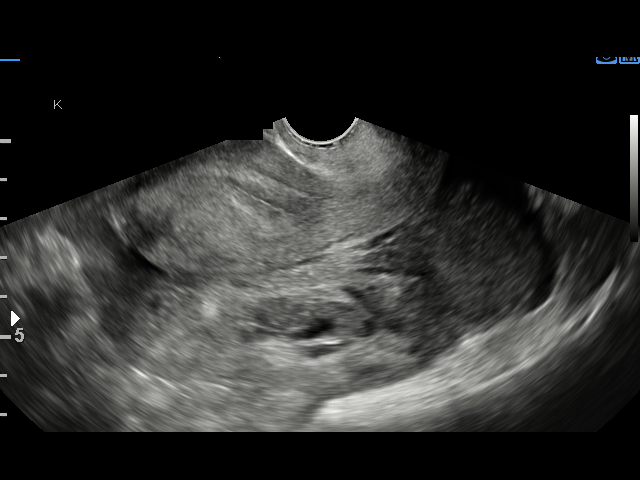
[im 43/43]
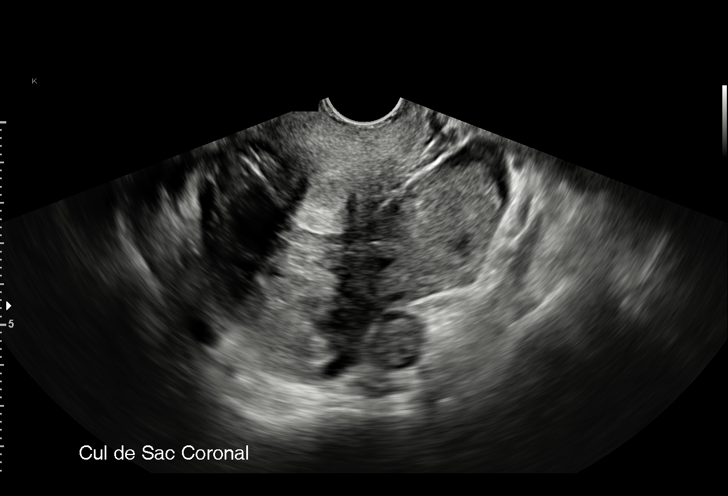

[15 of 28 positions shown; findings below may reference images not displayed]

FINDINGS: Intrauterine gestational sac: None

Maternal uterus/adnexae: There is a large amount of complex free
fluid the patient's pelvis. In the patient's pelvis. There is a
complex right adnexal tubular structure measuring approximately
x 1.9 x 2.5 cm. Endometrial stripe measures approximately 11 mm.
IMPRESSION: 1. No intrauterine pregnancy.
2. Large volume of complex free fluid in the patient's pelvis
concerning for a ruptured ectopic pregnancy.
3. Persistent right adnexal mass concerning for an ectopic
pregnancy.

ADDENDUM:
The above results were relayed to nurse practitioner Esfandiar Abay
at approximately 9 p.m. on 10/31/2019.

*** End of Addendum ***
FINDINGS: Intrauterine gestational sac: None

Maternal uterus/adnexae: There is a large amount of complex free
fluid the patient's pelvis. In the patient's pelvis. There is a
complex right adnexal tubular structure measuring approximately
x 1.9 x 2.5 cm. Endometrial stripe measures approximately 11 mm.
IMPRESSION: 1. No intrauterine pregnancy.
2. Large volume of complex free fluid in the patient's pelvis
concerning for a ruptured ectopic pregnancy.
3. Persistent right adnexal mass concerning for an ectopic
pregnancy.

## 2021-03-16 DIAGNOSIS — Z20822 Contact with and (suspected) exposure to covid-19: Secondary | ICD-10-CM | POA: Diagnosis not present

## 2021-03-16 DIAGNOSIS — J069 Acute upper respiratory infection, unspecified: Secondary | ICD-10-CM | POA: Diagnosis not present

## 2021-03-16 DIAGNOSIS — J029 Acute pharyngitis, unspecified: Secondary | ICD-10-CM | POA: Diagnosis not present

## 2021-03-31 DIAGNOSIS — Z01419 Encounter for gynecological examination (general) (routine) without abnormal findings: Secondary | ICD-10-CM | POA: Diagnosis not present

## 2021-03-31 DIAGNOSIS — Z113 Encounter for screening for infections with a predominantly sexual mode of transmission: Secondary | ICD-10-CM | POA: Diagnosis not present

## 2021-03-31 DIAGNOSIS — A609 Anogenital herpesviral infection, unspecified: Secondary | ICD-10-CM | POA: Diagnosis not present

## 2021-06-01 DIAGNOSIS — R102 Pelvic and perineal pain: Secondary | ICD-10-CM | POA: Diagnosis not present

## 2021-06-01 DIAGNOSIS — Z113 Encounter for screening for infections with a predominantly sexual mode of transmission: Secondary | ICD-10-CM | POA: Diagnosis not present

## 2022-01-27 ENCOUNTER — Inpatient Hospital Stay (HOSPITAL_COMMUNITY): Payer: Medicaid Other

## 2022-01-27 ENCOUNTER — Other Ambulatory Visit: Payer: Self-pay

## 2022-01-27 ENCOUNTER — Encounter (HOSPITAL_COMMUNITY): Payer: Self-pay | Admitting: *Deleted

## 2022-01-27 ENCOUNTER — Inpatient Hospital Stay (HOSPITAL_COMMUNITY)
Admission: AD | Admit: 2022-01-27 | Discharge: 2022-01-27 | Disposition: A | Discharge status: Home / Self Care | Payer: Medicaid Other | Attending: Obstetrics & Gynecology | Admitting: Obstetrics & Gynecology

## 2022-01-27 DIAGNOSIS — R102 Pelvic and perineal pain: Secondary | ICD-10-CM | POA: Insufficient documentation

## 2022-01-27 DIAGNOSIS — O3680X0 Pregnancy with inconclusive fetal viability, not applicable or unspecified: Secondary | ICD-10-CM | POA: Diagnosis present

## 2022-01-27 DIAGNOSIS — O26891 Other specified pregnancy related conditions, first trimester: Secondary | ICD-10-CM | POA: Diagnosis not present

## 2022-01-27 DIAGNOSIS — Z3A01 Less than 8 weeks gestation of pregnancy: Secondary | ICD-10-CM | POA: Diagnosis not present

## 2022-01-27 LAB — CBC
HCT: 35.8 % — ABNORMAL LOW (ref 36.0–46.0)
Hemoglobin: 12.4 g/dL (ref 12.0–15.0)
MCH: 32.4 pg (ref 26.0–34.0)
MCHC: 34.6 g/dL (ref 30.0–36.0)
MCV: 93.5 fL (ref 80.0–100.0)
Platelets: 307 10*3/uL (ref 150–400)
RBC: 3.83 MIL/uL — ABNORMAL LOW (ref 3.87–5.11)
RDW: 11.9 % (ref 11.5–15.5)
WBC: 7.8 10*3/uL (ref 4.0–10.5)
nRBC: 0 % (ref 0.0–0.2)

## 2022-01-27 LAB — URINALYSIS, ROUTINE W REFLEX MICROSCOPIC
Bilirubin Urine: NEGATIVE
Glucose, UA: NEGATIVE mg/dL
Hgb urine dipstick: NEGATIVE
Ketones, ur: NEGATIVE mg/dL
Leukocytes,Ua: NEGATIVE
Nitrite: NEGATIVE
Protein, ur: NEGATIVE mg/dL
Specific Gravity, Urine: 1.019 (ref 1.005–1.030)
pH: 7 (ref 5.0–8.0)

## 2022-01-27 LAB — POCT PREGNANCY, URINE: Preg Test, Ur: POSITIVE — AB

## 2022-01-27 LAB — HCG, QUANTITATIVE, PREGNANCY: hCG, Beta Chain, Quant, S: 170 m[IU]/mL — ABNORMAL HIGH (ref <5)

## 2022-01-27 LAB — WET PREP, GENITAL
Sperm: NONE SEEN
Trich, Wet Prep: NONE SEEN
WBC, Wet Prep HPF POC: <10 (ref <10)
Yeast Wet Prep HPF POC: NONE SEEN

## 2022-01-27 NOTE — MAU Provider Note (Signed)
?History  ?  ? ?CSN: [id number] ? ?Arrival date and time: 01/27/22 0865 ? ? Event Date/Time  ? First Provider Initiated Contact with Patient 01/27/22 1019   ?  ? ?Chief Complaint  ?Patient presents with  ?? Cramping  ? ?Maria Greer is a 31 y.o. G3P0010 at [redacted]w[redacted]d who presents today with lower abdominal cramping. X 3 days. She has a history of ectopic pregnancy with right salpingectomy in 2021 after failed methotrexate therapy.  ? ?Pelvic Pain ?The patient's primary symptoms include pelvic pain. This is a new problem. The current episode started in the past 7 days. The problem has been unchanged. The problem affects both sides. She is pregnant. The vaginal discharge was normal. There has been no bleeding. Nothing aggravates the symptoms. She has tried nothing for the symptoms. Her past medical history is significant for an ectopic pregnancy.  ? ?OB History   ? ? Gravida  ?3  ? Para  ?0  ? Term  ?   ? Preterm  ?   ? AB  ?1  ? Living  ?   ?  ? ? SAB  ?1  ? IAB  ?   ? Ectopic  ?   ? Multiple  ?   ? Live Births  ?   ?   ?  ?  ? ? ?Past Medical History:  ?Diagnosis Date  ?? Asthma   ?? Trichomonas   ?? Yeast vaginitis   ? ? ?Past Surgical History:  ?Procedure Laterality Date  ?? DIAGNOSTIC LAPAROSCOPY WITH REMOVAL OF ECTOPIC PREGNANCY Right 10/31/2019  ? Procedure: DIAGNOSTIC LAPAROSCOPY WITH RIGHT SALPINGECTOMY AND REMOVAL OF ECTOPIC PREGNANCY;  Surgeon: Hoover Browns, MD;  Location: [facility] OR;  Service: Gynecology;  Laterality: Right;  ? ? ?Family History  ?Problem Relation Age of Onset  ?? Hypertension Mother   ?? Cancer Maternal Grandmother 46  ?     Breast   ?? Diabetes Maternal Grandmother   ? ? ?Social History  ? ?Tobacco Use  ?? Smoking status: Never  ?? Smokeless tobacco: Never  ?Vaping Use  ?? Vaping Use: Never used  ?Substance Use Topics  ?? Alcohol use: Not Currently  ?  Comment: socially  ?? Drug use: No  ? ? ?Allergies:  ?Allergies  ?Allergen Reactions  ?? Peanut Oil Itching  ?? Shellfish Allergy Itching   ? ? ?Medications Prior to Admission  ?Medication Sig Dispense Refill Last Dose  ?? acetaminophen (TYLENOL) 500 MG tablet Take 500 mg by mouth every 6 (six) hours as needed.     ?? ibuprofen (ADVIL) 800 MG tablet Take 1 tablet (800 mg total) by mouth every 8 (eight) hours as needed. 30 tablet 0   ?? oxyCODONE-acetaminophen (PERCOCET/ROXICET) 5-325 MG tablet Take 1-2 tablets by mouth every 4 (four) hours as needed for up to 25 doses for severe pain. Use this medication additionally only if ibuprofen is not relieving abdominal pain.  Limit total acetaminophen used in 24 hrs to less than 4000 mg. 30 tablet 0   ? ? ?Review of Systems  ?Genitourinary:  Positive for pelvic pain.  ?All other systems reviewed and are negative. ?Physical Exam  ? ?Blood pressure 131/80, pulse 80, temperature 98.2 ?F (36.8 ?C), temperature source Oral, resp. rate 19, height 5\' 5"  (1.651 m), weight 83.6 kg, last menstrual period 12/31/2021, SpO2 100 %, unknown if currently breastfeeding. ? ?Physical Exam ?Constitutional:   ?   Appearance: She is well-developed.  ?HENT:  ?   Head: Normocephalic.  ?  Eyes:  ?   Pupils: Pupils are equal, round, and reactive to light.  ?Cardiovascular:  ?   Rate and Rhythm: Normal rate and regular rhythm.  ?   Heart sounds: Normal heart sounds.  ?Pulmonary:  ?   Effort: Pulmonary effort is normal. No respiratory distress.  ?   Breath sounds: Normal breath sounds.  ?Abdominal:  ?   Palpations: Abdomen is soft.  ?   Tenderness: There is no abdominal tenderness.  ?Genitourinary: ?   Vagina: No bleeding. Vaginal discharge: mucusy. ?   Comments: External: no lesion ?Vagina: small amount of white discharge ?  ? ? ?Musculoskeletal:     ?   General: Normal range of motion.  ?   Cervical back: Normal range of motion and neck supple.  ?Skin: ?   General: Skin is warm and dry.  ?Neurological:  ?   Mental Status: She is alert and oriented to person, place, and time.  ?Psychiatric:     ?   Mood and Affect: Mood normal.     ?    Behavior: Behavior normal.  ? ? ?Results for orders placed or performed during the hospital encounter of 01/27/22 (from the past 24 hour(s))  ?Urinalysis, Routine w reflex microscopic Urine, Clean Catch     Status: None  ? Collection Time: 01/27/22  9:49 AM  ?Result Value Ref Range  ? Color, Urine YELLOW YELLOW  ? APPearance CLEAR CLEAR  ? Specific Gravity, Urine 1.019 1.005 - 1.030  ? pH 7.0 5.0 - 8.0  ? Glucose, UA NEGATIVE NEGATIVE mg/dL  ? Hgb urine dipstick NEGATIVE NEGATIVE  ? Bilirubin Urine NEGATIVE NEGATIVE  ? Ketones, ur NEGATIVE NEGATIVE mg/dL  ? Protein, ur NEGATIVE NEGATIVE mg/dL  ? Nitrite NEGATIVE NEGATIVE  ? Leukocytes,Ua NEGATIVE NEGATIVE  ?Pregnancy, urine POC     Status: Abnormal  ? Collection Time: 01/27/22  9:51 AM  ?Result Value Ref Range  ? Preg Test, Ur POSITIVE (A) NEGATIVE  ?CBC     Status: Abnormal  ? Collection Time: 01/27/22 10:32 AM  ?Result Value Ref Range  ? WBC 7.8 4.0 - 10.5 K/uL  ? RBC 3.83 (L) 3.87 - 5.11 MIL/uL  ? Hemoglobin 12.4 12.0 - 15.0 g/dL  ? HCT 35.8 (L) 36.0 - 46.0 %  ? MCV 93.5 80.0 - 100.0 fL  ? MCH 32.4 26.0 - 34.0 pg  ? MCHC 34.6 30.0 - 36.0 g/dL  ? RDW 11.9 11.5 - 15.5 %  ? Platelets 307 150 - 400 K/uL  ? nRBC 0.0 0.0 - 0.2 %  ?hCG, quantitative, pregnancy     Status: Abnormal  ? Collection Time: 01/27/22 10:32 AM  ?Result Value Ref Range  ? hCG, Beta Chain, Quant, S 170 (H) <5 mIU/mL  ?Wet prep, genital     Status: Abnormal  ? Collection Time: 01/27/22 10:38 AM  ? Specimen: PATH Cytology Cervicovaginal Ancillary Only  ?Result Value Ref Range  ? Yeast Wet Prep HPF POC NONE SEEN NONE SEEN  ? Trich, Wet Prep NONE SEEN NONE SEEN  ? Clue Cells Wet Prep HPF POC PRESENT (A) NONE SEEN  ? WBC, Wet Prep HPF POC <10 <10  ? Sperm NONE SEEN   ? ?US OB LESS THAN 14 WEEKS WITH OB TRANSVAGINAL ? ?Result Date: 01/27/2022 ?CLINICAL DATA:  Pelvic pain for 3 days, history of ectopic, positive beta hCG EXAM: OBSTETRIC <14 WK [place] AND TRANSVAGINAL OB US TECHNIQUE: Both transabdominal and  transvaginal ultrasound examinations were performed for complete  evaluation of the gestation as well as the maternal uterus, adnexal regions, and pelvic cul-de-sac. Transvaginal technique was performed to assess early pregnancy. COMPARISON:  None Available. FINDINGS: Intrauterine gestational sac: None Yolk sac:  Not Visualized. Embryo:  Not Visualized. Cardiac Activity: Not Visualized. Heart Rate: Not applicable MSD: Not applicable CRL:  Not applicable Subchorionic hemorrhage:  Not applicable. Maternal uterus/adnexae: History of right salpingectomy for right-sided ectopic pregnancy. There are 2 cyst in the right ovary measuring up to 1.4 and 1.9 cm, the latter of which appears to be a possible hemorrhagic cyst and the former a probable follicle. There is small volume free fluid in the pelvis which appears simple. Thickened endometrium. IMPRESSION: No intrauterine gestational sac visualized, consistent with pregnancy of unknown location in this patient with a positive beta HCG. Small volume free fluid in the pelvis which appears simple. Recommend OBGYN consultation, trending beta HCGs, and repeat ultrasound as clinically indicated. Two follicles or cysts in right ovary measuring up to 1.4 x 1.9 cm, the latter of which appear to be a possible hemorrhagic cyst. Electronically Signed   By: Caprice RenshawJacob  Kahn M.D.   On: 01/27/2022 12:12   ? ? ?MAU Course  ?Procedures ? ?MDM ? ? ?Assessment and Plan  ? ?1. Pregnancy of unknown anatomic location   ?2. Pelvic pain in pregnancy, antepartum, first trimester   ? ?DC home in stable condition  ?FU Monday for repeat HCG  ?1st Trimester precautions  ?Bleeding precautions ?Ectopic precautions ?RX: none  ?Return to MAU as needed ? ? ? Follow-up Information   ? ? [facility].   ?Specialty: Obstetrics and Gynecology ?Why: Monday at 9:00am for repeat blood work ?Contact information: ?[address] ?[place]  [phone] ?[phone] ? ?  ?  ? ?  ?  ? ?  ? ?Thressa ShellerHeather Bambie Pizzolato DNP, CNM  ?01/27/22  12:24 PM  ? ? ?

## 2022-01-27 NOTE — Progress Notes (Signed)
Wet Prep & GC/Chlamydia cultures obtained via pt self swabbing after being instructions by RN. ?

## 2022-01-27 NOTE — MAU Note (Signed)
Maria Greer is a 31 y.o. at Unknown here in MAU reporting: lower abdominal cramping that began 3 days ago.  Denies VB.  +HPT.  Reports Hx of ectopic pregnancy. ?LMP: 12/31/2021 ?Onset of complaint: 3 days ago ?Pain score: 5/10 cramping ?Vitals:  ? 01/27/22 1013  ?BP: 131/80  ?Pulse: 80  ?Resp: 19  ?Temp: 98.2 ?F (36.8 ?C)  ?SpO2: 100%  ?   ?FHT:N/A ?Lab orders placed from triage:   UPT & UA ?

## 2022-01-30 ENCOUNTER — Ambulatory Visit (INDEPENDENT_AMBULATORY_CARE_PROVIDER_SITE_OTHER): Payer: Self-pay | Admitting: General Practice

## 2022-01-30 VITALS — BP 109/76 | HR 76 | Ht 65.0 in | Wt 184.0 lb

## 2022-01-30 DIAGNOSIS — O3680X0 Pregnancy with inconclusive fetal viability, not applicable or unspecified: Secondary | ICD-10-CM

## 2022-01-30 LAB — BETA HCG QUANT (REF LAB): hCG Quant: 594 m[IU]/mL

## 2022-01-30 LAB — GC/CHLAMYDIA PROBE AMP (~~LOC~~) NOT AT ARMC
Chlamydia: NEGATIVE
Comment: NEGATIVE
Comment: NORMAL
Neisseria Gonorrhea: NEGATIVE

## 2022-01-30 NOTE — Progress Notes (Signed)
Beta HCG Follow-up Visit ? ?Maria Greer presents to [facility] for follow-up beta HCG lab. She was seen in MAU for abdominal pain on May 5th. Patient reports  continued intermittent cramping today similar to MAU visit, rates pain at a 6 at its worst. Denies bleeding. Discussed with patient that we are following beta HCG levels today. Results will be back in approximately 2 hours. Valid contact number for patient confirmed. I will call the patient with results.  ? ?Beta HCG results: ?      01/27/22         170  ?      01/30/22         594  ? ?Results and patient history reviewed with Dr Debroah Loop, who states patient has had appropriate rise in bhcg. She should have follow up ultrasound in 2 weeks or can follow up at Ob/Gyn provider.  ? ?Patient called and informed of results/plan for follow-up. Patient verbalized understanding & would like to follow up at our office. Scheduled ultrasound for 5/23 @ 8am. Ectopic precautions reviewed. Patient verbalized understanding. ? ?[name] ?01/30/2022 10:58 AM   ?

## 2022-02-14 ENCOUNTER — Ambulatory Visit (INDEPENDENT_AMBULATORY_CARE_PROVIDER_SITE_OTHER): Payer: Medicaid Other

## 2022-02-14 DIAGNOSIS — O3680X0 Pregnancy with inconclusive fetal viability, not applicable or unspecified: Secondary | ICD-10-CM

## 2022-03-14 LAB — OB RESULTS CONSOLE HEPATITIS B SURFACE ANTIGEN: Hepatitis B Surface Ag: NEGATIVE

## 2022-03-14 LAB — HEPATITIS C ANTIBODY: HCV Ab: NEGATIVE

## 2022-03-14 LAB — OB RESULTS CONSOLE RUBELLA ANTIBODY, IGM: Rubella: IMMUNE

## 2022-03-14 LAB — OB RESULTS CONSOLE HIV ANTIBODY (ROUTINE TESTING): HIV: NONREACTIVE

## 2022-03-21 ENCOUNTER — Telehealth: Payer: Self-pay

## 2022-03-29 ENCOUNTER — Encounter: Payer: Self-pay | Admitting: Obstetrics and Gynecology

## 2022-03-29 NOTE — Progress Notes (Signed)
Patient did not keep her new OB appointment for 03/29/2022.  Cornelia Copa MD Attending Center for [employer])

## 2022-06-20 ENCOUNTER — Telehealth: Payer: Self-pay

## 2022-06-20 NOTE — Telephone Encounter (Signed)
Left voicemail for patient to call back and schedule an ultrasound in our clinic.

## 2022-07-10 LAB — OB RESULTS CONSOLE HIV ANTIBODY (ROUTINE TESTING): HIV: NONREACTIVE

## 2022-07-17 LAB — OB RESULTS CONSOLE RPR: RPR: NONREACTIVE

## 2022-07-19 LAB — OB RESULTS CONSOLE GC/CHLAMYDIA
Chlamydia: NEGATIVE
Neisseria Gonorrhea: NEGATIVE

## 2022-09-01 ENCOUNTER — Encounter (HOSPITAL_BASED_OUTPATIENT_CLINIC_OR_DEPARTMENT_OTHER): Payer: Self-pay | Admitting: *Deleted

## 2022-09-01 ENCOUNTER — Other Ambulatory Visit: Payer: Self-pay

## 2022-09-01 ENCOUNTER — Emergency Department (HOSPITAL_BASED_OUTPATIENT_CLINIC_OR_DEPARTMENT_OTHER)
Admission: EM | Admit: 2022-09-01 | Discharge: 2022-09-01 | Disposition: A | Discharge status: Home / Self Care | Payer: Medicaid Other | Attending: Emergency Medicine | Admitting: Emergency Medicine

## 2022-09-01 DIAGNOSIS — O2243 Hemorrhoids in pregnancy, third trimester: Secondary | ICD-10-CM | POA: Insufficient documentation

## 2022-09-01 DIAGNOSIS — Z3A35 35 weeks gestation of pregnancy: Secondary | ICD-10-CM | POA: Diagnosis not present

## 2022-09-01 DIAGNOSIS — K649 Unspecified hemorrhoids: Secondary | ICD-10-CM

## 2022-09-01 MED ORDER — DOCUSATE SODIUM 100 MG PO CAPS: 100.0000 mg | ORAL_CAPSULE | Freq: Two times a day (BID) | ORAL | 0 refills | Status: DC

## 2022-09-01 MED ORDER — DERMOPLAST 20-0.5 % EX AERO: 1.0000 | INHALATION_SPRAY | Freq: Four times a day (QID) | CUTANEOUS | 0 refills | Status: DC | PRN

## 2022-09-01 MED ORDER — POLYETHYLENE GLYCOL 3350 17 GM/SCOOP PO POWD: 1.0000 | Freq: Once | ORAL | 0 refills | Status: AC

## 2022-09-01 MED ORDER — HYDROCORT-PRAMOXINE (PERIANAL) 1-1 % EX FOAM: 1.0000 | Freq: Two times a day (BID) | CUTANEOUS | 0 refills | Status: DC

## 2022-09-01 NOTE — Discharge Instructions (Addendum)
Using sitz bath at home multiple times daily  Use the medications as prescribed.  Follow-up outpatient with OB/GYN or PCP

## 2022-09-01 NOTE — ED Triage Notes (Signed)
Pt is here for evaluation of pain in rectum due to marble size hemorrhoids.  LBM several days ago. Pt is [redacted] weeks pregnant and spoke with obgyn and was given script for hydrocortisone cream but could not see pt and was advised to come to ED if pain continued.  No pregnancy or labor complaints.

## 2022-09-01 NOTE — ED Provider Notes (Signed)
[facility] EMERGENCY DEPT Provider Note   CSN: [id number] Arrival date & time: 09/01/22  1645    History  Chief Complaint  Patient presents with   Hemorrhoids    Maria Greer is a 31 y.o. female G1 P0 proximately 35 weeks 4 days pregnant here for evaluation of hemorrhoids.  Began 2 days ago.  She has chronic constipation, bowel movements every 4 days.  Does not take anything for constipation at home.  Attempted to see OB/GYN however was not able to make an appointment.  Was called in hydrocortisone.  Has used this for 1 day.  No relief.  Has not had bowel movement in over 4 days.  No bleeding.  With regards to her pregnancy she denies any concerns.  She has good fetal movement.  No abdominal pain, back pain.  No bleeding, fluid leakage.       HPI     Home Medications Prior to Admission medications   Medication Sig Start Date End Date Taking? Authorizing Provider  benzocaine-Menthol (DERMOPLAST) 20-0.5 % AERO Apply 1 Application topically 4 (four) times daily as needed for irritation. 09/01/22  Yes Archana Eckman A, PA-C  docusate sodium (COLACE) 100 MG capsule Take 1 capsule (100 mg total) by mouth every 12 (twelve) hours. 09/01/22  Yes Neveen Daponte A, PA-C  hydrocortisone-pramoxine (PROCTOFOAM-HC) rectal foam Place 1 applicator rectally 2 (two) times daily. 09/01/22  Yes Cristhian Vanhook A, PA-C  polyethylene glycol powder (GLYCOLAX/MIRALAX) 17 GM/SCOOP powder Take 255 g by mouth once for 1 dose. 09/01/22 09/01/22 Yes Rashaan Wyles A, PA-C  acetaminophen (TYLENOL) 500 MG tablet Take 500 mg by mouth every 6 (six) hours as needed.    [provider]  oxyCODONE-acetaminophen (PERCOCET/ROXICET) 5-325 MG tablet Take 1-2 tablets by mouth every 4 (four) hours as needed for up to 25 doses for severe pain. Use this medication additionally only if ibuprofen is not relieving abdominal pain.  Limit total acetaminophen used in 24 hrs to less than 4000 mg. 11/01/19    Waymon Amato, MD      Allergies    Peanut oil and Shellfish allergy    Review of Systems   Review of Systems  Constitutional: Negative.   HENT: Negative.    Respiratory: Negative.    Cardiovascular: Negative.   Gastrointestinal:  Positive for rectal pain. Negative for abdominal distention, abdominal pain, anal bleeding, blood in stool, constipation, diarrhea, nausea and vomiting.  Genitourinary: Negative.   Musculoskeletal: Negative.   Skin: Negative.   Neurological: Negative.   All other systems reviewed and are negative.   Physical Exam Updated Vital Signs BP 111/73 (BP Location: Right Arm)   Pulse 100   Temp 98.3 F (36.8 C) (Oral)   Resp 16   LMP 12/31/2021   SpO2 100%  Physical Exam Vitals and nursing note reviewed. Exam conducted with a chaperone present.  Constitutional:      General: She is not in acute distress.    Appearance: She is well-developed. She is not ill-appearing, toxic-appearing or diaphoretic.  HENT:     Head: Atraumatic.  Eyes:     Pupils: Pupils are equal, round, and reactive to light.  Cardiovascular:     Rate and Rhythm: Normal rate.     Pulses: Normal pulses.     Heart sounds: Normal heart sounds.  Pulmonary:     Effort: No respiratory distress.  Abdominal:     General: There is no distension.     Comments: Gravid abdomen, fetal heart tones  150  Genitourinary:    Comments: RN present in room for exam.  No fissures.  1 thrombosed, 1 nonthrombosed hemorrhoid.  No active bleeding.  No fissure. Musculoskeletal:        General: Normal range of motion.     Cervical back: Normal range of motion.  Skin:    General: Skin is warm and dry.  Neurological:     General: No focal deficit present.     Mental Status: She is alert.  Psychiatric:        Mood and Affect: Mood normal.    ED Results / Procedures / Treatments   Labs (all labs ordered are listed, but only abnormal results are displayed) Labs Reviewed - No data to  display  EKG None  Radiology No results found.  Procedures Procedures    Medications Ordered in ED Medications - No data to display  ED Course/ Medical Decision Making/ A&P     31 year old G1 P0 approximately [redacted] weeks pregnant here for evaluation of hemorrhoids which began 2 days ago.  On exam she has 1 thrombosed, nonthrombosed hemorrhoid.  She has no overlying skin changes to suggest infectious process.  Low suspicion for cellulitis, abscess.  She has no anal fissure.  No active bleeding.  No complaints regarding pregnancy.  Good fetal movement.  No abdominal pain, vaginal bleeding or fluid leakage.  Fetal heart tones 150.  DC home with symptomatic management.  Does states she has some constipation at baseline, last bowel movement approximately 4 days ago.  Encouraged stool softeners at home, sitz bath's, medications.  She will follow-up with OB or PCP.  The patient has been appropriately medically screened and/or stabilized in the ED. I have low suspicion for any other emergent medical condition which would require further screening, evaluation or treatment in the ED or require inpatient management.  Patient is hemodynamically stable and in no acute distress.  Patient able to ambulate in department prior to ED.  Evaluation does not show acute pathology that would require ongoing or additional emergent interventions while in the emergency department or further inpatient treatment.  I have discussed the diagnosis with the patient and answered all questions.  Pain is been managed while in the emergency department and patient has no further complaints prior to discharge.  Patient is comfortable with plan discussed in room and is stable for discharge at this time.  I have discussed strict return precautions for returning to the emergency department.  Patient was encouraged to follow-up with PCP/specialist refer to at discharge.                            Medical Decision Making Amount  and/or Complexity of Data Reviewed External Data Reviewed: labs, radiology and notes.  Risk OTC drugs. Prescription drug management. Diagnosis or treatment significantly limited by social determinants of health.          Final Clinical Impression(s) / ED Diagnoses Final diagnoses:  Hemorrhoids, unspecified hemorrhoid type    Rx / DC Orders ED Discharge Orders          Ordered    hydrocortisone-pramoxine (PROCTOFOAM-HC) rectal foam  2 times daily        09/01/22 1730    benzocaine-Menthol (DERMOPLAST) 20-0.5 % AERO  4 times daily PRN        09/01/22 1730    docusate sodium (COLACE) 100 MG capsule  Every 12 hours        09/01/22  1730    polyethylene glycol powder (GLYCOLAX/MIRALAX) 17 GM/SCOOP powder   Once        09/01/22 1730              Skylier Kretschmer A, PA-C 09/01/22 1811    Jeanell Sparrow, DO 09/02/22 1959

## 2022-09-12 LAB — OB RESULTS CONSOLE GBS: GBS: NEGATIVE

## 2022-10-06 ENCOUNTER — Encounter (HOSPITAL_COMMUNITY): Payer: Self-pay | Admitting: Obstetrics and Gynecology

## 2022-10-06 ENCOUNTER — Other Ambulatory Visit: Payer: Self-pay

## 2022-10-06 ENCOUNTER — Inpatient Hospital Stay (HOSPITAL_COMMUNITY): Payer: Medicaid Other | Admitting: Anesthesiology

## 2022-10-06 ENCOUNTER — Inpatient Hospital Stay (HOSPITAL_COMMUNITY)
Admission: RE | Admit: 2022-10-06 | Discharge: 2022-10-11 | DRG: 787 | Disposition: A | Discharge status: Home / Self Care | Payer: Medicaid Other | Attending: Obstetrics and Gynecology | Admitting: Obstetrics and Gynecology

## 2022-10-06 DIAGNOSIS — J45909 Unspecified asthma, uncomplicated: Secondary | ICD-10-CM | POA: Diagnosis not present

## 2022-10-06 DIAGNOSIS — Z3A39 39 weeks gestation of pregnancy: Secondary | ICD-10-CM | POA: Diagnosis not present

## 2022-10-06 DIAGNOSIS — A6000 Herpesviral infection of urogenital system, unspecified: Secondary | ICD-10-CM | POA: Diagnosis present

## 2022-10-06 DIAGNOSIS — O134 Gestational [pregnancy-induced] hypertension without significant proteinuria, complicating childbirth: Secondary | ICD-10-CM | POA: Diagnosis present

## 2022-10-06 DIAGNOSIS — O9902 Anemia complicating childbirth: Secondary | ICD-10-CM | POA: Diagnosis present

## 2022-10-06 DIAGNOSIS — D62 Acute posthemorrhagic anemia: Secondary | ICD-10-CM | POA: Diagnosis not present

## 2022-10-06 DIAGNOSIS — O139 Gestational [pregnancy-induced] hypertension without significant proteinuria, unspecified trimester: Secondary | ICD-10-CM | POA: Diagnosis not present

## 2022-10-06 DIAGNOSIS — O3663X0 Maternal care for excessive fetal growth, third trimester, not applicable or unspecified: Secondary | ICD-10-CM | POA: Diagnosis present

## 2022-10-06 DIAGNOSIS — O9832 Other infections with a predominantly sexual mode of transmission complicating childbirth: Secondary | ICD-10-CM | POA: Diagnosis present

## 2022-10-06 DIAGNOSIS — R7989 Other specified abnormal findings of blood chemistry: Secondary | ICD-10-CM | POA: Diagnosis not present

## 2022-10-06 DIAGNOSIS — Z3A40 40 weeks gestation of pregnancy: Secondary | ICD-10-CM | POA: Diagnosis not present

## 2022-10-06 DIAGNOSIS — O9952 Diseases of the respiratory system complicating childbirth: Secondary | ICD-10-CM | POA: Diagnosis not present

## 2022-10-06 DIAGNOSIS — O4103X0 Oligohydramnios, third trimester, not applicable or unspecified: Principal | ICD-10-CM | POA: Diagnosis present

## 2022-10-06 DIAGNOSIS — O4100X0 Oligohydramnios, unspecified trimester, not applicable or unspecified: Secondary | ICD-10-CM | POA: Diagnosis present

## 2022-10-06 DIAGNOSIS — Z98891 History of uterine scar from previous surgery: Secondary | ICD-10-CM

## 2022-10-06 LAB — CBC
HCT: 32.4 % — ABNORMAL LOW (ref 36.0–46.0)
Hemoglobin: 10.5 g/dL — ABNORMAL LOW (ref 12.0–15.0)
MCH: 31 pg (ref 26.0–34.0)
MCHC: 32.4 g/dL (ref 30.0–36.0)
MCV: 95.6 fL (ref 80.0–100.0)
Platelets: 307 10*3/uL (ref 150–400)
RBC: 3.39 MIL/uL — ABNORMAL LOW (ref 3.87–5.11)
RDW: 14.2 % (ref 11.5–15.5)
WBC: 7.2 10*3/uL (ref 4.0–10.5)
nRBC: 0 % (ref 0.0–0.2)

## 2022-10-06 LAB — TYPE AND SCREEN
ABO/RH(D): A POS
Antibody Screen: NEGATIVE

## 2022-10-06 MED ORDER — OXYTOCIN-SODIUM CHLORIDE 30-0.9 UT/500ML-% IV SOLN
1.0000 m[IU]/min | INTRAVENOUS | Status: DC
  Administered 2022-10-06: 2 m[IU]/min via INTRAVENOUS

## 2022-10-06 MED ORDER — FENTANYL-BUPIVACAINE-NACL 0.5-0.125-0.9 MG/250ML-% EP SOLN
12.0000 mL/h | EPIDURAL | Status: DC | PRN
  Administered 2022-10-06 – 2022-10-07 (×3): 12 mL/h via EPIDURAL
  Filled 2022-10-06 (×3): qty 250

## 2022-10-06 MED ORDER — LACTATED RINGERS IV SOLN: 500.0000 mL | Freq: Once | INTRAVENOUS | Status: DC

## 2022-10-06 MED ORDER — SODIUM BICARBONATE 8.4 % IV SOLN
INTRAVENOUS | Status: DC | PRN
  Administered 2022-10-06: 5 mL via EPIDURAL

## 2022-10-06 MED ORDER — LACTATED RINGERS IV SOLN: INTRAVENOUS | Status: DC

## 2022-10-06 MED ORDER — LIDOCAINE HCL (PF) 1 % IJ SOLN: 30.0000 mL | INTRAMUSCULAR | Status: DC | PRN

## 2022-10-06 MED ORDER — LACTATED RINGERS IV SOLN: 500.0000 mL | INTRAVENOUS | Status: DC | PRN

## 2022-10-06 MED ORDER — PHENYLEPHRINE 80 MCG/ML (10ML) SYRINGE FOR IV PUSH (FOR BLOOD PRESSURE SUPPORT): 80.0000 ug | PREFILLED_SYRINGE | INTRAVENOUS | Status: DC | PRN

## 2022-10-06 MED ORDER — SOD CITRATE-CITRIC ACID 500-334 MG/5ML PO SOLN
30.0000 mL | ORAL | Status: DC | PRN
  Administered 2022-10-07: 30 mL via ORAL
  Filled 2022-10-06: qty 30

## 2022-10-06 MED ORDER — TERBUTALINE SULFATE 1 MG/ML IJ SOLN
0.2500 mg | Freq: Once | INTRAMUSCULAR | Status: AC | PRN
  Administered 2022-10-07: 0.25 mg via SUBCUTANEOUS
  Filled 2022-10-06: qty 1

## 2022-10-06 MED ORDER — HYDROXYZINE HCL 50 MG PO TABS
50.0000 mg | ORAL_TABLET | Freq: Four times a day (QID) | ORAL | Status: DC | PRN
  Administered 2022-10-06 – 2022-10-07 (×2): 50 mg via ORAL
  Filled 2022-10-06 (×2): qty 1

## 2022-10-06 MED ORDER — EPHEDRINE 5 MG/ML INJ: 10.0000 mg | INTRAVENOUS | Status: DC | PRN

## 2022-10-06 MED ORDER — FENTANYL-BUPIVACAINE-NACL 0.5-0.125-0.9 MG/250ML-% EP SOLN: 12.0000 mL/h | EPIDURAL | Status: DC | PRN

## 2022-10-06 MED ORDER — FENTANYL CITRATE (PF) 100 MCG/2ML IJ SOLN: 50.0000 ug | INTRAMUSCULAR | Status: DC | PRN

## 2022-10-06 MED ORDER — ACETAMINOPHEN 325 MG PO TABS
650.0000 mg | ORAL_TABLET | ORAL | Status: DC | PRN
  Administered 2022-10-06: 650 mg via ORAL
  Filled 2022-10-06: qty 2

## 2022-10-06 MED ORDER — ONDANSETRON HCL 4 MG/2ML IJ SOLN: 4.0000 mg | Freq: Four times a day (QID) | INTRAMUSCULAR | Status: DC | PRN

## 2022-10-06 MED ORDER — DIPHENHYDRAMINE HCL 50 MG/ML IJ SOLN: 12.5000 mg | INTRAMUSCULAR | Status: DC | PRN

## 2022-10-06 MED ORDER — OXYTOCIN BOLUS FROM INFUSION: 333.0000 mL | Freq: Once | INTRAVENOUS | Status: DC

## 2022-10-06 MED ORDER — FLEET ENEMA 7-19 GM/118ML RE ENEM: 1.0000 | ENEMA | RECTAL | Status: DC | PRN

## 2022-10-06 MED ORDER — OXYTOCIN-SODIUM CHLORIDE 30-0.9 UT/500ML-% IV SOLN
2.5000 [IU]/h | INTRAVENOUS | Status: DC
  Filled 2022-10-06: qty 500

## 2022-10-06 NOTE — Progress Notes (Addendum)
Subjective:    Doing well with contractions and sitting on the ball for comfort. Discussed amniotomy and pt agrees.   Objective:    VS: BP 123/80   Pulse 78   Temp 98.1 F (36.7 C) (Oral)   Resp 16   Ht 5\' 5"  (1.651 m)   Wt 89.4 kg   LMP 12/31/2021   BMI 32.78 kg/m  FHR : baseline 145 / variability moderate / accelerations present / variable decelerations Toco: contractions every 2-4 minutes  Membranes: AROM, clear, moderate amount Dilation: 4.5 Effacement (%): 80 Station: -2 Presentation: Vertex Exam by:: Clois Dupes, CNM Pitocin 8 mU/min  Assessment/Plan:   32 y.o. G3P0020 [redacted]w[redacted]d IOL for oligohydramnios  Labor:  Progressing on Pitocin, amniotomy performed Fetal Wellbeing:  Category I Pain Control:  Labor support without medications I/D:   GBS neg Anticipated MOD:  NSVD  Arrie Eastern DNP, CNM 10/06/2022 9:10 PM

## 2022-10-06 NOTE — Anesthesia Procedure Notes (Signed)
Epidural Patient location during procedure: OB Start time: 10/06/2022 10:31 PM End time: 10/06/2022 10:37 PM  Staffing Anesthesiologist: Effie Berkshire, MD Performed: anesthesiologist   Preanesthetic Checklist Completed: patient identified, IV checked, site marked, risks and benefits discussed, surgical consent, monitors and equipment checked, pre-op evaluation and timeout performed  Epidural Patient position: sitting Prep: DuraPrep Patient monitoring: heart rate, continuous pulse ox and blood pressure Approach: midline Location: L3-L4 Injection technique: LOR saline  Needle:  Needle type: Tuohy  Needle gauge: 17 G Needle length: 9 cm Catheter type: closed end flexible Catheter size: 20 Guage Test dose: negative and 1.5% lidocaine  Assessment Events: blood not aspirated, no cerebrospinal fluid, injection not painful, no injection resistance and no paresthesia  Additional Notes LOR @ 5  Patient identified. Risks/Benefits/Options discussed with patient including but not limited to bleeding, infection, nerve damage, paralysis, failed block, incomplete pain control, headache, blood pressure changes, nausea, vomiting, reactions to medications, itching and postpartum back pain. Confirmed with bedside nurse the patient's most recent platelet count. Confirmed with patient that they are not currently taking any anticoagulation, have any bleeding history or any family history of bleeding disorders. Patient expressed understanding and wished to proceed. All questions were answered. Sterile technique was used throughout the entire procedure. Please see nursing notes for vital signs. Test dose was given through epidural catheter and negative prior to continuing to dose epidural or start infusion. Warning signs of high block given to the patient including shortness of breath, tingling/numbness in hands, complete motor block, or any concerning symptoms with instructions to call for help. Patient  was given instructions on fall risk and not to get out of bed. All questions and concerns addressed with instructions to call with any issues or inadequate analgesia.    Reason for block:procedure for pain

## 2022-10-06 NOTE — H&P (Signed)
OB ADMISSION/ HISTORY & PHYSICAL:  Admission Date: 10/06/2022  1:09 PM  Admit Diagnosis: Oligohydramnios  Maria Greer is a 32 y.o. female G3P0020 [redacted]w[redacted]d presenting for IOL for oligohydramnios. ndorses active FM, denies LOF and vaginal bleeding. Potential LGA fetus, EFW on 10/06/22 10+13 (98), previous growth 98%ile at anatomy, 92%ile at 30 weeks. Pt presented to Dr. Mancel Bale. Pt was then counseled on risk of shoulder dystocia secondary to LGA and pt offered a primary cesarean. Pt desires induction labor MD notified. Hx of ectopic in 10/2019 with right salpingectomy.   History of current pregnancy: G3P0020   Patient entered care with [facility] at 10+6 wks.   EDC by LMP and congruent w/ 6+4 wk U/S.   Anatomy scan:  19+6 wks, complete w/  Anterior/Fundal placenta.   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    Prenatal Labs: ABO, Rh: --/--/A POS (01/12 1439) Antibody: NEG (01/12 1439) Rubella:   immune RPR:   NR HBsAg:   NR HIV:   NR GTT: normal 1 hr GBS:   neg GC/CHL: neg/neg Genetics: low-risk, neg [facility] Vaccines: Tdap: declined Influenza: declined   OB History  Gravida Para Term Preterm AB Living  3 0     2    SAB IAB Ectopic Multiple Live Births  1   1        # Outcome Date GA Lbr Len/2nd Weight Sex Delivery Anes PTL Lv  3 Current           2 Ectopic 11/01/19          1 SAB 2016            Medical / Surgical History: Past medical history:  Past Medical History:  Diagnosis Date   Asthma    Trichomonas    Yeast vaginitis     Past surgical history:  Past Surgical History:  Procedure Laterality Date   DIAGNOSTIC LAPAROSCOPY WITH REMOVAL OF ECTOPIC PREGNANCY Right 10/31/2019   Procedure: DIAGNOSTIC LAPAROSCOPY WITH RIGHT SALPINGECTOMY AND REMOVAL OF ECTOPIC PREGNANCY;  Surgeon: Waymon Amato, MD;   Location: [facility];  Service: Gynecology;  Laterality: Right;   Family History:  Family History  Problem Relation Age of Onset   Hypertension Mother    Cancer Maternal Grandmother 5       Breast    Diabetes Maternal Grandmother     Social History:  reports that she has never smoked. She has never used smokeless tobacco. She reports that she does not currently use alcohol. She reports that she does not use drugs.  Allergies: Peanut oil and Shellfish allergy   Current Medications at time of admission:  Prior to Admission medications   Medication Sig Start Date End Date Taking? Authorizing Provider  acetaminophen (TYLENOL) 500 MG tablet Take 500 mg by mouth every 6 (six) hours as needed.    [provider]  benzocaine-Menthol (DERMOPLAST) 20-0.5 % AERO Apply 1 Application topically 4 (four) times daily as needed for irritation. 09/01/22   Henderly, Britni A, PA-C  docusate sodium (COLACE) 100 MG capsule Take 1 capsule (100 mg total) by mouth every 12 (twelve) hours. 09/01/22   Henderly, Britni A, PA-C  hydrocortisone-pramoxine (PROCTOFOAM-HC) rectal foam Place 1 applicator rectally 2 (two) times daily. 09/01/22   Henderly, Britni A, PA-C  oxyCODONE-acetaminophen (PERCOCET/ROXICET) 5-325 MG tablet Take 1-2  tablets by mouth every 4 (four) hours as needed for up to 25 doses for severe pain. Use this medication additionally only if ibuprofen is not relieving abdominal pain.  Limit total acetaminophen used in 24 hrs to less than 4000 mg. 11/01/19   Waymon Amato, MD    Review of Systems: Constitutional: Negative   HENT: Negative   Eyes: Negative   Respiratory: Negative   Cardiovascular: Negative   Gastrointestinal: Negative  Genitourinary: neg for bloody show, neg for LOF   Musculoskeletal: Negative   Skin: Negative   Neurological: Negative   Endo/Heme/Allergies: Negative   Psychiatric/Behavioral: Negative    Physical Exam: VS: Last menstrual period 12/31/2021, unknown if currently  breastfeeding. AAO x3, no signs of distress Cardiovascular: RRR Respiratory: Unlabored GU/GI: Abdomen gravid, non-tender, non-distended, active FM, vertex Extremities: no edema, negative for pain, tenderness, and cords  Cervical exam:Dilation: 3 Effacement (%): 80 Station: -2 Exam by:: Burman Foster, CNM FHR: baseline rate 140 / variability moderate / accelerations present / absent decelerations TOCO: irreg   Prenatal Transfer Tool  Maternal Diabetes: No Genetic Screening: Normal Maternal Ultrasounds/Referrals: Normal Fetal Ultrasounds or other Referrals:  None Maternal Substance Abuse:  No Significant Maternal Medications:  Meds include: Other:  Valtrex since 36 wks weeks Significant Maternal Lab Results: Group B Strep negative Number of Prenatal Visits:greater than 3 verified prenatal visits Other Comments:  None    Assessment: 32 y.o. G3P0020 [redacted]w[redacted]d Oligohydramnios Induction of labor  FHR category 1 GBS neg Pain management plan: nitrous gas HSV    -Speculum exam neg    -Valtrex since    -no prodromal symptoms   Plan:  Admit to L&D Routine admission orders Epidural PRN Pitocin Recommendation of Primary C/S reiterated and pt requests to proceed with IOL Dr Mancel Bale notified of admission and plan of care  Arrie Eastern DNP, CNM 10/06/2022 1:26 PM

## 2022-10-06 NOTE — Anesthesia Preprocedure Evaluation (Signed)
Anesthesia Evaluation  Patient identified by MRN, date of birth, ID band Patient awake    Reviewed: Allergy & Precautions, Patient's Chart, lab work & pertinent test results  Airway Mallampati: I       Dental   Pulmonary asthma    Pulmonary exam normal        Cardiovascular negative cardio ROS Normal cardiovascular exam     Neuro/Psych    GI/Hepatic   Endo/Other    Renal/GU      Musculoskeletal   Abdominal   Peds  Hematology   Anesthesia Other Findings   Reproductive/Obstetrics (+) Pregnancy                              Anesthesia Physical Anesthesia Plan  ASA: 2  Anesthesia Plan: Epidural   Post-op Pain Management:    Induction:   PONV Risk Score and Plan: 0  Airway Management Planned: Natural Airway  Additional Equipment: None  Intra-op Plan:   Post-operative Plan:   Informed Consent: I have reviewed the patients History and Physical, chart, labs and discussed the procedure including the risks, benefits and alternatives for the proposed anesthesia with the patient or authorized representative who has indicated his/her understanding and acceptance.       Plan Discussed with:   Anesthesia Plan Comments: (Lab Results      Component                Value               Date                      WBC                      7.2                 10/06/2022                HGB                      10.5 (L)            10/06/2022                HCT                      32.4 (L)            10/06/2022                MCV                      95.6                10/06/2022                PLT                      307                 10/06/2022           )         Anesthesia Quick Evaluation

## 2022-10-07 ENCOUNTER — Encounter (HOSPITAL_COMMUNITY): Payer: Self-pay | Admitting: Obstetrics and Gynecology

## 2022-10-07 ENCOUNTER — Encounter (HOSPITAL_COMMUNITY)
Admission: RE | Disposition: A | Discharge status: Home / Self Care | Payer: Self-pay | Source: Home / Self Care | Attending: Obstetrics and Gynecology

## 2022-10-07 LAB — RPR: RPR Ser Ql: NONREACTIVE

## 2022-10-07 SURGERY — Surgical Case
Anesthesia: Epidural

## 2022-10-07 MED ORDER — SODIUM CHLORIDE 0.9 % IV SOLN
INTRAVENOUS | Status: DC | PRN
  Administered 2022-10-07: 500 mg via INTRAVENOUS

## 2022-10-07 MED ORDER — MORPHINE SULFATE (PF) 0.5 MG/ML IJ SOLN
INTRAMUSCULAR | Status: DC | PRN
  Administered 2022-10-07: 3 mg via EPIDURAL

## 2022-10-07 MED ORDER — DIPHENHYDRAMINE HCL 25 MG PO CAPS: 25.0000 mg | ORAL_CAPSULE | ORAL | Status: DC | PRN

## 2022-10-07 MED ORDER — HYDROMORPHONE HCL 1 MG/ML IJ SOLN: 0.2500 mg | INTRAMUSCULAR | Status: DC | PRN

## 2022-10-07 MED ORDER — DEXAMETHASONE SODIUM PHOSPHATE 10 MG/ML IJ SOLN
INTRAMUSCULAR | Status: DC | PRN
  Administered 2022-10-07: 10 mg via INTRAVENOUS

## 2022-10-07 MED ORDER — KETOROLAC TROMETHAMINE 30 MG/ML IJ SOLN: 30.0000 mg | Freq: Once | INTRAMUSCULAR | Status: DC | PRN

## 2022-10-07 MED ORDER — CEFAZOLIN SODIUM-DEXTROSE 2-3 GM-%(50ML) IV SOLR
INTRAVENOUS | Status: DC | PRN
  Administered 2022-10-07: 2 g via INTRAVENOUS

## 2022-10-07 MED ORDER — NALOXONE HCL 4 MG/10ML IJ SOLN: 1.0000 ug/kg/h | INTRAVENOUS | Status: DC | PRN

## 2022-10-07 MED ORDER — METOCLOPRAMIDE HCL 5 MG/ML IJ SOLN
INTRAMUSCULAR | Status: DC | PRN
  Administered 2022-10-07: 10 mg via INTRAVENOUS

## 2022-10-07 MED ORDER — OXYCODONE HCL 5 MG PO TABS: 5.0000 mg | ORAL_TABLET | Freq: Once | ORAL | Status: DC | PRN

## 2022-10-07 MED ORDER — KETOROLAC TROMETHAMINE 30 MG/ML IJ SOLN: 30.0000 mg | Freq: Four times a day (QID) | INTRAMUSCULAR | Status: DC | PRN

## 2022-10-07 MED ORDER — TRIAMCINOLONE ACETONIDE 40 MG/ML IJ SUSP
INTRAMUSCULAR | Status: AC
  Filled 2022-10-07: qty 1

## 2022-10-07 MED ORDER — OXYTOCIN-SODIUM CHLORIDE 30-0.9 UT/500ML-% IV SOLN
INTRAVENOUS | Status: DC | PRN
  Administered 2022-10-07: 999 mL/h via INTRAVENOUS

## 2022-10-07 MED ORDER — FENTANYL CITRATE (PF) 100 MCG/2ML IJ SOLN
INTRAMUSCULAR | Status: AC
  Filled 2022-10-07: qty 2

## 2022-10-07 MED ORDER — ONDANSETRON HCL 4 MG/2ML IJ SOLN: 4.0000 mg | Freq: Once | INTRAMUSCULAR | Status: DC | PRN

## 2022-10-07 MED ORDER — PHENYLEPHRINE HCL (PRESSORS) 10 MG/ML IV SOLN
INTRAVENOUS | Status: DC | PRN
  Administered 2022-10-07: 40 ug via INTRAVENOUS
  Administered 2022-10-07: 80 ug via INTRAVENOUS
  Administered 2022-10-07: 160 ug via INTRAVENOUS

## 2022-10-07 MED ORDER — ONDANSETRON HCL 4 MG/2ML IJ SOLN
INTRAMUSCULAR | Status: DC | PRN
  Administered 2022-10-07: 4 mg via INTRAVENOUS

## 2022-10-07 MED ORDER — NALOXONE HCL 0.4 MG/ML IJ SOLN: 0.4000 mg | INTRAMUSCULAR | Status: DC | PRN

## 2022-10-07 MED ORDER — SCOPOLAMINE 1 MG/3DAYS TD PT72
1.0000 | MEDICATED_PATCH | Freq: Once | TRANSDERMAL | Status: AC
  Administered 2022-10-08: 1.5 mg via TRANSDERMAL
  Filled 2022-10-07: qty 1

## 2022-10-07 MED ORDER — FENTANYL CITRATE (PF) 100 MCG/2ML IJ SOLN
INTRAMUSCULAR | Status: DC | PRN
  Administered 2022-10-07: 100 ug via EPIDURAL

## 2022-10-07 MED ORDER — OXYTOCIN-SODIUM CHLORIDE 30-0.9 UT/500ML-% IV SOLN
INTRAVENOUS | Status: AC
  Filled 2022-10-07: qty 500

## 2022-10-07 MED ORDER — OXYCODONE HCL 5 MG/5ML PO SOLN: 5.0000 mg | Freq: Once | ORAL | Status: DC | PRN

## 2022-10-07 MED ORDER — MEPERIDINE HCL 25 MG/ML IJ SOLN: 6.2500 mg | INTRAMUSCULAR | Status: DC | PRN

## 2022-10-07 MED ORDER — AMISULPRIDE (ANTIEMETIC) 5 MG/2ML IV SOLN: 10.0000 mg | Freq: Once | INTRAVENOUS | Status: DC | PRN

## 2022-10-07 MED ORDER — METHYLERGONOVINE MALEATE 0.2 MG/ML IJ SOLN
INTRAMUSCULAR | Status: DC | PRN
  Administered 2022-10-07: .2 mg via INTRAMUSCULAR

## 2022-10-07 MED ORDER — ONDANSETRON HCL 4 MG/2ML IJ SOLN: 4.0000 mg | Freq: Three times a day (TID) | INTRAMUSCULAR | Status: DC | PRN

## 2022-10-07 MED ORDER — ACETAMINOPHEN 500 MG PO TABS: 1000.0000 mg | ORAL_TABLET | Freq: Four times a day (QID) | ORAL | Status: DC

## 2022-10-07 MED ORDER — DIPHENHYDRAMINE HCL 50 MG/ML IJ SOLN: 12.5000 mg | INTRAMUSCULAR | Status: DC | PRN

## 2022-10-07 MED ORDER — SODIUM CHLORIDE 0.9% FLUSH: 3.0000 mL | INTRAVENOUS | Status: DC | PRN

## 2022-10-07 MED ORDER — LACTATED RINGERS IV SOLN: INTRAVENOUS | Status: DC | PRN

## 2022-10-07 MED ORDER — OXYTOCIN-SODIUM CHLORIDE 30-0.9 UT/500ML-% IV SOLN
1.0000 m[IU]/min | INTRAVENOUS | Status: DC
  Administered 2022-10-07: 1 m[IU]/min via INTRAVENOUS

## 2022-10-07 MED ORDER — SODIUM CHLORIDE (PF) 0.9 % IJ SOLN
INTRAMUSCULAR | Status: AC
  Filled 2022-10-07: qty 10

## 2022-10-07 MED ORDER — FENTANYL CITRATE (PF) 100 MCG/2ML IJ SOLN
INTRAMUSCULAR | Status: DC | PRN
  Administered 2022-10-07: 100 ug via INTRAVENOUS

## 2022-10-07 MED ORDER — MORPHINE SULFATE (PF) 0.5 MG/ML IJ SOLN
INTRAMUSCULAR | Status: AC
  Filled 2022-10-07: qty 10

## 2022-10-07 MED ORDER — TRANEXAMIC ACID-NACL 1000-0.7 MG/100ML-% IV SOLN
INTRAVENOUS | Status: DC | PRN
  Administered 2022-10-07: 1000 mg via INTRAVENOUS

## 2022-10-07 MED ORDER — LIDOCAINE-EPINEPHRINE (PF) 2 %-1:200000 IJ SOLN
INTRAMUSCULAR | Status: DC | PRN
  Administered 2022-10-07: 2 mL via EPIDURAL
  Administered 2022-10-07: 10 mL via EPIDURAL
  Administered 2022-10-07: 3 mL via EPIDURAL
  Administered 2022-10-08: 5 mL via EPIDURAL

## 2022-10-07 SURGICAL SUPPLY — 36 items
BARRIER ADHS 3X4 INTERCEED (GAUZE/BANDAGES/DRESSINGS) IMPLANT
BENZOIN TINCTURE PRP APPL 2/3 (GAUZE/BANDAGES/DRESSINGS) ×1 IMPLANT
CHLORAPREP W/TINT 26 (MISCELLANEOUS) ×2 IMPLANT
CLAMP UMBILICAL CORD (MISCELLANEOUS) ×1 IMPLANT
CLOTH BEACON ORANGE TIMEOUT ST (SAFETY) ×1 IMPLANT
DRSG OPSITE POSTOP 4X10 (GAUZE/BANDAGES/DRESSINGS) ×1 IMPLANT
ELECT REM PT RETURN 9FT ADLT (ELECTROSURGICAL) ×1
ELECTRODE REM PT RTRN 9FT ADLT (ELECTROSURGICAL) ×1 IMPLANT
EXTRACTOR VACUUM M CUP 4 TUBE (SUCTIONS) IMPLANT
GAUZE SPONGE 4X4 12PLY STRL LF (GAUZE/BANDAGES/DRESSINGS) IMPLANT
GLOVE BIO SURGEON STRL SZ7.5 (GLOVE) ×1 IMPLANT
GLOVE BIOGEL PI IND STRL 7.0 (GLOVE) ×1 IMPLANT
GLOVE BIOGEL PI IND STRL 7.5 (GLOVE) ×1 IMPLANT
GOWN STRL REUS W/TWL LRG LVL3 (GOWN DISPOSABLE) ×2 IMPLANT
KIT ABG SYR 3ML LUER SLIP (SYRINGE) IMPLANT
NDL HYPO 25X5/8 SAFETYGLIDE (NEEDLE) IMPLANT
NEEDLE HYPO 25X5/8 SAFETYGLIDE (NEEDLE) IMPLANT
NS IRRIG 1000ML POUR BTL (IV SOLUTION) ×1 IMPLANT
PACK C SECTION WH (CUSTOM PROCEDURE TRAY) ×1 IMPLANT
PAD ABD 7.5X8 STRL (GAUZE/BANDAGES/DRESSINGS) IMPLANT
PAD OB MATERNITY 4.3X12.25 (PERSONAL CARE ITEMS) ×1 IMPLANT
RTRCTR C-SECT PINK 25CM LRG (MISCELLANEOUS) ×1 IMPLANT
STRIP CLOSURE SKIN 1/2X4 (GAUZE/BANDAGES/DRESSINGS) ×1 IMPLANT
SUT CHROMIC 2 0 CT 1 (SUTURE) ×1 IMPLANT
SUT MNCRL 0 VIOLET CTX 36 (SUTURE) ×1 IMPLANT
SUT MNCRL AB 3-0 PS2 27 (SUTURE) ×1 IMPLANT
SUT MONOCRYL 0 CTX 36 (SUTURE) ×1
SUT PLAIN 2 0 XLH (SUTURE) ×1 IMPLANT
SUT VIC AB 0 CT1 36 (SUTURE) ×1 IMPLANT
SUT VIC AB 0 CTX 36 (SUTURE) ×5
SUT VIC AB 0 CTX36XBRD ANBCTRL (SUTURE) ×3 IMPLANT
SUT VIC AB 2-0 SH 27 (SUTURE)
SUT VIC AB 2-0 SH 27XBRD (SUTURE) IMPLANT
TOWEL OR 17X24 6PK STRL BLUE (TOWEL DISPOSABLE) ×1 IMPLANT
TRAY FOLEY W/BAG SLVR 14FR LF (SET/KITS/TRAYS/PACK) ×1 IMPLANT
WATER STERILE IRR 1000ML POUR (IV SOLUTION) ×1 IMPLANT

## 2022-10-07 NOTE — Progress Notes (Signed)
Maria Greer is a 32 y.o. G3P0020 at [redacted]w[redacted]d  Subjective: Comfortable s/p epidural  Objective: BP (!) 141/87   Pulse (!) 104   Temp 98.5 F (36.9 C) (Axillary)   Resp 16   Ht 5\' 5"  (1.651 m)   Wt 89.4 kg   LMP 12/31/2021   SpO2 100%   BMI 32.78 kg/m  No intake/output data recorded. No intake/output data recorded.  FHT:  FHR: 145 bpm, variability: moderate,  accelerations:  Present,  decelerations:  Absent UC:   regular, every 4-5 minutes SVE:   Dilation: 6 Effacement (%): 80 Station: -1, 0 Exam by:: [employer] RN  Labs: Lab Results  Component Value Date   WBC 7.2 10/06/2022   HGB 10.5 (L) 10/06/2022   HCT 32.4 (L) 10/06/2022   MCV 95.6 10/06/2022   PLT 307 10/06/2022    Assessment / Plan: Induction of labor due to oligo,  progressing well on pitocin  Labor:  protracted course and pitocin held d/t decels earlier this morning.  Lengthy discussion regarding risks of vaginal delivery of LGA/macrosomic baby including but not limited to shoulder dystocia but also worse case scenarios.  Pt still refuses c-section despite strong recommendation otherwise, questions answered FOB and brother present for discussion.  Will restart low dose pitocin (1x1) to MVUs of 180-220.  Explained possibility of fetus not tolerating labor as well with the restart of pitocin. Preeclampsia:   n/a Fetal Wellbeing:  Category I Pain Control:  Epidural I/D:   GBS neg Anticipated MOD:   C-Section  Delice Lesch, MD 10/07/2022, 9:59 AM

## 2022-10-07 NOTE — Progress Notes (Signed)
Maria Greer is a 32 y.o. G3P0020 at [redacted]w[redacted]d   Subjective: Pt held off on c-section all day but ok with proceeding now because she would like to have the baby before MN.  Objective: BP 135/83   Pulse 89   Temp 98.8 F (37.1 C) (Axillary)   Resp 18   Ht 5\' 5"  (1.651 m)   Wt 89.4 kg   LMP 12/31/2021   SpO2 100%   BMI 32.78 kg/m  No intake/output data recorded. No intake/output data recorded.  FHT:  FHR: 145 bpm, variability: moderate,  accelerations:  Present,  decelerations:  Absent UC:   regular, every 2-3 minutes SVE:   Dilation: Lip/rim Effacement (%): 90 Station: 0 Exam by:: Leanord Asal, RN  Labs: Lab Results  Component Value Date   WBC 7.2 10/06/2022   HGB 10.5 (L) 10/06/2022   HCT 32.4 (L) 10/06/2022   MCV 95.6 10/06/2022   PLT 307 10/06/2022    Assessment / Plan: Induction of labor due to oligo,  progressing well on pitocin  Labor:  Contractions finally adequate for last hour still with no descent and persistent AL and 0 station but now with caput.  Pt earlier in the day was asking nurse to hold off on increasing pitocin d/t fear of decels which she experienced over night. (Patient is an Therapist, sports). Preeclampsia:   n/a Fetal Wellbeing:  Category I Pain Control:  Epidural I/D:   GBS neg Anticipated MOD:   C-Section - pt is finally agreeable to proceed.  Risks benefits alternatives including but not limited to bleeding infection and injury were discussed with the patient and FOB.  They verbalized understanding and consent signed and witnessed.  Delice Lesch, MD 10/07/2022, 10:54 PM

## 2022-10-07 NOTE — Progress Notes (Signed)
Now that I have had a chance to examine pt myself EFW is about 8.5lbs VE 8/80/-1 FHT cat 1 Unsure about mode of delivery, guarded vaginal delivery based on ultrasound efw. Pt continues to decline c/s

## 2022-10-07 NOTE — Progress Notes (Signed)
Maria Greer is a 32 y.o. G3P0020 at [redacted]w[redacted]d  Subjective: Comfortable with epidural.  Feels pressure in vagina.  Objective: BP 129/74   Pulse 93   Temp 98.5 F (36.9 C) (Oral)   Resp 15   Ht 5\' 5"  (1.651 m)   Wt 89.4 kg   LMP 12/31/2021   SpO2 100%   BMI 32.78 kg/m  No intake/output data recorded. No intake/output data recorded.  FHT:  FHR: 140 bpm, variability: moderate,  accelerations:  Present,  decelerations:  Absent UC:   regular, every 3 minutes SVE:   Dilation: 9 Effacement (%): 80 Station: 0 Exam by:: Mancel Bale MD  Labs: Lab Results  Component Value Date   WBC 7.2 10/06/2022   HGB 10.5 (L) 10/06/2022   HCT 32.4 (L) 10/06/2022   MCV 95.6 10/06/2022   PLT 307 10/06/2022    Assessment / Plan: IOL d/t oligo  Labor:  MVU 110-120.  Cont to titrate pitocin as indicated per protocol. Preeclampsia:   n/a Fetal Wellbeing:  Category I Pain Control:  Epidural I/D:   GBS neg Anticipated MOD:   guarded about vaginal delivery  Delice Lesch, MD 10/07/2022, 5:20 PM

## 2022-10-07 NOTE — Progress Notes (Signed)
Subjective:    Comfortable with epidural, family present and supportive. Discussed slow progression from latent to active phase and   Objective:    VS: BP 111/77   Pulse 93   Temp 99.7 F (37.6 C) (Oral)   Resp 14   Ht 5\' 5"  (1.651 m)   Wt 89.4 kg   LMP 12/31/2021   SpO2 100%   BMI 32.78 kg/m  FHR : baseline 145 / variability moderate / accelerations present / early decelerations IUPC: contractions every 2-3 minutes / MVU 150-200 Membranes: AROM x 8 hrs, remains clear, mom afebrile Dilation: 6 Effacement (%): 80 Station: -2 Presentation: Vertex Exam by:: Maria Greer, CNM Pitocin 8 mU/min  Assessment/Plan:   32 y.o. G3P0020 [redacted]w[redacted]d IOL for oligo Suspect LGA >4000 grams  Labor:  slow progression from latent to active phase, IUPC placed, continue to titrate Pitocin Fetal Wellbeing:  Category I Pain Control:  Epidural I/D:   GBS neg Anticipated MOD:  discussed indicators for primary C/S and pt requests to continue induction, but is open to P C/S. Dr. Mancel Bale made aware.   [name], CNM 10/07/2022 6:01 AM

## 2022-10-08 ENCOUNTER — Other Ambulatory Visit: Payer: Self-pay

## 2022-10-08 ENCOUNTER — Encounter (HOSPITAL_COMMUNITY): Payer: Self-pay | Admitting: Obstetrics and Gynecology

## 2022-10-08 LAB — LACTATE DEHYDROGENASE
LDH: 211 U/L — ABNORMAL HIGH (ref 98–192)
LDH: 195 U/L — ABNORMAL HIGH (ref 98–192)

## 2022-10-08 LAB — COMPREHENSIVE METABOLIC PANEL
ALT: 13 U/L (ref 0–44)
ALT: 11 U/L (ref 0–44)
ALT: 12 U/L (ref 0–44)
AST: 27 U/L (ref 15–41)
AST: 24 U/L (ref 15–41)
AST: 24 U/L (ref 15–41)
Albumin: 2.3 g/dL — ABNORMAL LOW (ref 3.5–5.0)
Albumin: 2.2 g/dL — ABNORMAL LOW (ref 3.5–5.0)
Albumin: 2.2 g/dL — ABNORMAL LOW (ref 3.5–5.0)
Alkaline Phosphatase: 71 U/L (ref 38–126)
Alkaline Phosphatase: 69 U/L (ref 38–126)
Alkaline Phosphatase: 65 U/L (ref 38–126)
Anion gap: 8 (ref 5–15)
Anion gap: 8 (ref 5–15)
Anion gap: 7 (ref 5–15)
BUN: 10 mg/dL (ref 6–20)
BUN: 12 mg/dL (ref 6–20)
BUN: 12 mg/dL (ref 6–20)
CO2: 20 mmol/L — ABNORMAL LOW (ref 22–32)
CO2: 19 mmol/L — ABNORMAL LOW (ref 22–32)
CO2: 20 mmol/L — ABNORMAL LOW (ref 22–32)
Calcium: 8 mg/dL — ABNORMAL LOW (ref 8.9–10.3)
Calcium: 8.1 mg/dL — ABNORMAL LOW (ref 8.9–10.3)
Calcium: 7.9 mg/dL — ABNORMAL LOW (ref 8.9–10.3)
Chloride: 106 mmol/L (ref 98–111)
Chloride: 107 mmol/L (ref 98–111)
Chloride: 108 mmol/L (ref 98–111)
Creatinine, Ser: 1.82 mg/dL — ABNORMAL HIGH (ref 0.44–1.00)
Creatinine, Ser: 1.41 mg/dL — ABNORMAL HIGH (ref 0.44–1.00)
Creatinine, Ser: 1.17 mg/dL — ABNORMAL HIGH (ref 0.44–1.00)
GFR, Estimated: 37 mL/min — ABNORMAL LOW (ref >60)
GFR, Estimated: 51 mL/min — ABNORMAL LOW (ref >60)
GFR, Estimated: >60 mL/min (ref >60)
Glucose, Bld: 172 mg/dL — ABNORMAL HIGH (ref 70–99)
Glucose, Bld: 137 mg/dL — ABNORMAL HIGH (ref 70–99)
Glucose, Bld: 143 mg/dL — ABNORMAL HIGH (ref 70–99)
Potassium: 4.2 mmol/L (ref 3.5–5.1)
Potassium: 4.1 mmol/L (ref 3.5–5.1)
Potassium: 3.9 mmol/L (ref 3.5–5.1)
Sodium: 134 mmol/L — ABNORMAL LOW (ref 135–145)
Sodium: 134 mmol/L — ABNORMAL LOW (ref 135–145)
Sodium: 135 mmol/L (ref 135–145)
Total Bilirubin: 0.8 mg/dL (ref 0.3–1.2)
Total Bilirubin: 0.4 mg/dL (ref 0.3–1.2)
Total Bilirubin: 0.3 mg/dL (ref 0.3–1.2)
Total Protein: 5.1 g/dL — ABNORMAL LOW (ref 6.5–8.1)
Total Protein: 4.9 g/dL — ABNORMAL LOW (ref 6.5–8.1)
Total Protein: 4.8 g/dL — ABNORMAL LOW (ref 6.5–8.1)

## 2022-10-08 LAB — CBC
HCT: 27.5 % — ABNORMAL LOW (ref 36.0–46.0)
HCT: 24.7 % — ABNORMAL LOW (ref 36.0–46.0)
HCT: 22.9 % — ABNORMAL LOW (ref 36.0–46.0)
Hemoglobin: 9.4 g/dL — ABNORMAL LOW (ref 12.0–15.0)
Hemoglobin: 8.6 g/dL — ABNORMAL LOW (ref 12.0–15.0)
Hemoglobin: 7.6 g/dL — ABNORMAL LOW (ref 12.0–15.0)
MCH: 31.3 pg (ref 26.0–34.0)
MCH: 32 pg (ref 26.0–34.0)
MCH: 31 pg (ref 26.0–34.0)
MCHC: 34.2 g/dL (ref 30.0–36.0)
MCHC: 34.8 g/dL (ref 30.0–36.0)
MCHC: 33.2 g/dL (ref 30.0–36.0)
MCV: 91.7 fL (ref 80.0–100.0)
MCV: 91.8 fL (ref 80.0–100.0)
MCV: 93.5 fL (ref 80.0–100.0)
Platelets: 259 10*3/uL (ref 150–400)
Platelets: 257 10*3/uL (ref 150–400)
Platelets: 237 10*3/uL (ref 150–400)
RBC: 3 MIL/uL — ABNORMAL LOW (ref 3.87–5.11)
RBC: 2.69 MIL/uL — ABNORMAL LOW (ref 3.87–5.11)
RBC: 2.45 MIL/uL — ABNORMAL LOW (ref 3.87–5.11)
RDW: 13.3 % (ref 11.5–15.5)
RDW: 13.4 % (ref 11.5–15.5)
RDW: 13.2 % (ref 11.5–15.5)
WBC: 14.7 10*3/uL — ABNORMAL HIGH (ref 4.0–10.5)
WBC: 14.5 10*3/uL — ABNORMAL HIGH (ref 4.0–10.5)
WBC: 13.8 10*3/uL — ABNORMAL HIGH (ref 4.0–10.5)
nRBC: 0 % (ref 0.0–0.2)
nRBC: 0 % (ref 0.0–0.2)
nRBC: 0 % (ref 0.0–0.2)

## 2022-10-08 LAB — PROTEIN / CREATININE RATIO, URINE
Creatinine, Urine: 59 mg/dL
Protein Creatinine Ratio: 0.14 mg/mg{Cre} (ref 0.00–0.15)
Total Protein, Urine: 8 mg/dL

## 2022-10-08 MED ORDER — WITCH HAZEL-GLYCERIN EX PADS: 1.0000 | MEDICATED_PAD | CUTANEOUS | Status: DC | PRN

## 2022-10-08 MED ORDER — FERROUS FUMARATE 324 (106 FE) MG PO TABS
1.0000 | ORAL_TABLET | ORAL | Status: DC
  Filled 2022-10-08: qty 1

## 2022-10-08 MED ORDER — SODIUM CHLORIDE 0.9 % IR SOLN
Status: DC | PRN
  Administered 2022-10-08: 1

## 2022-10-08 MED ORDER — SIMETHICONE 80 MG PO CHEW
80.0000 mg | CHEWABLE_TABLET | Freq: Three times a day (TID) | ORAL | Status: DC
  Administered 2022-10-08 – 2022-10-11 (×7): 80 mg via ORAL
  Filled 2022-10-08 (×8): qty 1

## 2022-10-08 MED ORDER — SENNOSIDES-DOCUSATE SODIUM 8.6-50 MG PO TABS
2.0000 | ORAL_TABLET | Freq: Every day | ORAL | Status: DC
  Administered 2022-10-09 – 2022-10-11 (×3): 2 via ORAL
  Filled 2022-10-08 (×4): qty 2

## 2022-10-08 MED ORDER — ACETAMINOPHEN 500 MG PO TABS
1000.0000 mg | ORAL_TABLET | Freq: Four times a day (QID) | ORAL | Status: DC
  Administered 2022-10-08 – 2022-10-09 (×5): 1000 mg via ORAL
  Filled 2022-10-08 (×6): qty 2

## 2022-10-08 MED ORDER — MENTHOL 3 MG MT LOZG: 1.0000 | LOZENGE | OROMUCOSAL | Status: DC | PRN

## 2022-10-08 MED ORDER — FENTANYL CITRATE (PF) 100 MCG/2ML IJ SOLN: 50.0000 ug | INTRAMUSCULAR | Status: DC | PRN

## 2022-10-08 MED ORDER — KETOROLAC TROMETHAMINE 30 MG/ML IJ SOLN
30.0000 mg | Freq: Four times a day (QID) | INTRAMUSCULAR | Status: AC
  Administered 2022-10-08 – 2022-10-09 (×4): 30 mg via INTRAVENOUS
  Filled 2022-10-08 (×4): qty 1

## 2022-10-08 MED ORDER — ALBUMIN HUMAN 5 % IV SOLN
INTRAVENOUS | Status: AC
  Filled 2022-10-08: qty 250

## 2022-10-08 MED ORDER — ZOLPIDEM TARTRATE 5 MG PO TABS: 5.0000 mg | ORAL_TABLET | Freq: Every evening | ORAL | Status: DC | PRN

## 2022-10-08 MED ORDER — LACTATED RINGERS IV SOLN: INTRAVENOUS | Status: DC

## 2022-10-08 MED ORDER — SIMETHICONE 80 MG PO CHEW: 80.0000 mg | CHEWABLE_TABLET | ORAL | Status: DC | PRN

## 2022-10-08 MED ORDER — DIBUCAINE (PERIANAL) 1 % EX OINT: 1.0000 | TOPICAL_OINTMENT | CUTANEOUS | Status: DC | PRN

## 2022-10-08 MED ORDER — IBUPROFEN 600 MG PO TABS
600.0000 mg | ORAL_TABLET | Freq: Four times a day (QID) | ORAL | Status: DC
  Administered 2022-10-09: 600 mg via ORAL
  Filled 2022-10-08 (×2): qty 1

## 2022-10-08 MED ORDER — ALBUMIN HUMAN 5 % IV SOLN: INTRAVENOUS | Status: DC | PRN

## 2022-10-08 MED ORDER — PRENATAL MULTIVITAMIN CH
1.0000 | ORAL_TABLET | Freq: Every day | ORAL | Status: DC
  Administered 2022-10-08 – 2022-10-10 (×3): 1 via ORAL
  Filled 2022-10-08 (×3): qty 1

## 2022-10-08 MED ORDER — OXYTOCIN-SODIUM CHLORIDE 30-0.9 UT/500ML-% IV SOLN: 2.5000 [IU]/h | INTRAVENOUS | Status: AC

## 2022-10-08 MED ORDER — SODIUM CHLORIDE 0.9 % IV SOLN
500.0000 mg | Freq: Once | INTRAVENOUS | Status: AC
  Administered 2022-10-08: 500 mg via INTRAVENOUS
  Filled 2022-10-08: qty 25

## 2022-10-08 MED ORDER — SODIUM CHLORIDE 0.9 % IV SOLN
3.0000 g | Freq: Four times a day (QID) | INTRAVENOUS | Status: AC
  Administered 2022-10-08 – 2022-10-09 (×4): 3 g via INTRAVENOUS
  Filled 2022-10-08 (×4): qty 8

## 2022-10-08 MED ORDER — COCONUT OIL OIL: 1.0000 | TOPICAL_OIL | Status: DC | PRN

## 2022-10-08 MED ORDER — DIPHENHYDRAMINE HCL 25 MG PO CAPS: 25.0000 mg | ORAL_CAPSULE | Freq: Four times a day (QID) | ORAL | Status: DC | PRN

## 2022-10-08 MED ORDER — TRIAMCINOLONE ACETONIDE 40 MG/ML IJ SUSP
INTRAMUSCULAR | Status: DC | PRN
  Administered 2022-10-08: 40 mg via INTRAMUSCULAR

## 2022-10-08 MED ORDER — OXYCODONE HCL 5 MG PO TABS
5.0000 mg | ORAL_TABLET | ORAL | Status: DC | PRN
  Administered 2022-10-09 – 2022-10-11 (×10): 5 mg via ORAL
  Filled 2022-10-08: qty 2
  Filled 2022-10-08 (×9): qty 1

## 2022-10-08 NOTE — Progress Notes (Signed)
I spoke to Dr Mancel Bale regarding IV infusions. Dr Mancel Bale stated when FE IV is started, stop LR. After FE infusion, infuse Unasyn. After Unisyn restart LR at 128ml/hr. Encourage pt to drink water. Dr Mancel Bale will follow 1700 labs.

## 2022-10-08 NOTE — Progress Notes (Signed)
Dr. Mancel Bale called nurse, requested PCR be collected from foley and leave foley in until order is placed by provider to remove foley.   Lewanda Rife, RN

## 2022-10-08 NOTE — Op Note (Signed)
Maria Section Procedure Note  Indications: P1 at 40wks admitted for IOL with FTP and LGA.  Pre-operative Diagnosis: 1.40wks 2.Large for gestational age fetus 21.Failure to progress   Post-operative Diagnosis: 1.40wks 2.Large for gestational age fetus 64.Failure to progress  Procedure: Primary Low Transverse Maria Section  Surgeon: Everett Graff, Maria    Assistants: Surgical Tech  Anesthesia: Regional  Anesthesiologist: Pervis Hocking, DO   Procedure Details  The patient was taken to the operating room secondary to FTP and LGA after the risks, benefits, complications, treatment options, and expected outcomes were discussed with the patient.  The patient concurred with the proposed plan, giving informed consent which was signed and witnessed. The patient was taken to Operating Room B, identified as Clarene Critchley and the procedure verified as C-Section Delivery. A Time Out was held and the above information confirmed.  After induction of anesthesia by obtaining a spinal, the patient was prepped and draped in the usual sterile manner. A Pfannenstiel skin incision was made and carried down through the subcutaneous tissue to the underlying layer of fascia.  The fascia was incised bilaterally and extended transversely bilaterally with the Mayo scissors. Kocher clamps were placed on the inferior aspect of the fascial incision and the underlying rectus muscle was separated from the fascia. The same was done on the superior aspect of the fascial incision.  The peritoneum was identified, entered bluntly and extended manually.  An Alexis self-retaining retractor was placed.  The utero-vesical peritoneal reflection was incised transversely and the bladder flap was bluntly freed from the lower uterine segment. A low transverse uterine incision was made with the scalpel and extended bilaterally with the bandage scissors.  The infant was delivered in vertex position and ROP presentation without  difficulty.  After the umbilical cord was clamped and cut, the infant was handed to the awaiting pediatricians.  Cord blood was obtained for evaluation.  The placenta was removed intact and appeared to be within normal limits. The uterus was cleared of all clots and debris. A left extension of the uterine incision and the primary uterine incision were closed with a running interlocking suture of 0 Vicryl and a second imbricating layer was performed as well.  Bilateral tubes and ovaries appeared to be within normal limits.  Good hemostasis was noted.  Copious irrigation was performed until clear.  Interceed was placed over the incision.  The peritoneum was repaired with 2-0 chromic via a running suture.  The fascia was reapproximated with a running suture of 0 Vicryl.  The skin was reapproximated with a subcuticular suture of 3-0 monocryl.  Steristrips were applied.  Instrument, sponge, and needle counts were correct prior to abdominal closure and at the conclusion of the case.  The patient was awaiting transfer to the recovery room in good condition.  Findings: Live female infant with Apgars 8 at one minute and 9 at five minutes.  Normal appearing bilateral ovaries and fallopian tubes were noted.  Estimated Blood Loss:  1071 ml         Drains: foley to gravity 200 cc clear urine         Total IV Fluids: 2000 ml LR and 250 cc albumin         Specimens to Pathology: none         Complications:  None; patient tolerated the procedure well.         Disposition: PACU - hemodynamically stable.         Condition: stable  Attending Attestation: I performed the procedure.  I was present and scrubbed and the assistant was required due to complexity of anatomy.  Assistance was given at the level of a surgical tech.

## 2022-10-08 NOTE — Progress Notes (Signed)
Subjective: Postpartum Day 1: Cesarean Delivery just before MN 10/07/22 Patient reports tolerating PO.  Feels like she has to get her bearings when standing.  Objective: Vital signs in last 24 hours: Temp:  [97.8 F (36.6 C)-100.7 F (38.2 C)] 97.8 F (36.6 C) (01/14 1259) Pulse Rate:  [79-115] 79 (01/14 1259) Resp:  [15-22] 18 (01/14 1259) BP: (102-142)/(60-111) 102/62 (01/14 1259) SpO2:  [95 %-100 %] 97 % (01/14 1259)  Physical Exam:  General: alert and no distress Lochia: appropriate Uterine Fundus: FF, mild tenderness Incision: dressing c/d/i DVT Evaluation: no calf tenderness, SCDs when recumbent No flank tenderness bilaterally  Recent Labs    10/08/22 0517 10/08/22 1105  HGB 9.4* 8.6*  HCT 27.5* 24.7*    Assessment/Plan: Status post Cesarean section. Postoperative course complicated by elevated creatinine.  Reviewed with the patient.  Trending down.  Suspect prerenal but discussed other possible etiologies as well. Repeat labs later today.    1.Continue current care 2.Encourage IS 3.SCDs to LEs 4.Anemia - will order IV venofer Mild fundal tenderness - will observe for s/sxs of endometritis.  5.Pt had temp 100.7 shortly after delivery and is currently getting unasyn IV q6 for 24hrs.  Afebrile since that 100.7 temp. 6.GHTN with mild range BPs, nl U PCR, asymptomatic and elevated creatinine as mentioned above trending down.  Will cont to observe.  May d/c foley but cont strict Is/Os and IV fluids. 7.Circumcision prior to d/c home.  Delice Lesch, MD 10/08/2022, 3:11 PM

## 2022-10-08 NOTE — Anesthesia Postprocedure Evaluation (Signed)
Anesthesia Post Note  Patient: Maria Greer  Procedure(s) Performed: CESAREAN SECTION     Patient location during evaluation: PACU Anesthesia Type: Epidural Level of consciousness: awake and alert and oriented Pain management: pain level controlled Vital Signs Assessment: post-procedure vital signs reviewed and stable Respiratory status: spontaneous breathing, nonlabored ventilation and respiratory function stable Cardiovascular status: blood pressure returned to baseline and stable Postop Assessment: no headache, no backache, patient able to bend at knees and epidural receding Anesthetic complications: no   No notable events documented.  Last Vitals:  Vitals:   10/08/22 0254 10/08/22 0348  BP: 130/80 132/87  Pulse: 96 89  Resp: 18 18  Temp: (!) 38.2 C 37.4 C  SpO2: 98%     Last Pain:  Vitals:   10/08/22 0348  TempSrc: Oral  PainSc:    Pain Goal:                   Pervis Hocking

## 2022-10-08 NOTE — Lactation Note (Signed)
This note was copied from a baby's chart. Lactation Consultation Note  Patient Name: Maria Greer Today's Date: 10/08/2022 Reason for consult: Initial assessment;Primapara;1st time breastfeeding;Term Age:34 hours  LC in to visit with P53 Mom of term baby delivered by C/Section for FTP.  Baby has been sleepy and not showing any feeding cues.  Reassured Mom that this is WNL for a FT infant in the first 20-24 hrs of life.    Reviewed breast massage and hand expression, unable to express any colostrum.  Areola edema noted making it difficult to compress.    LC tried to do suck training on finger, baby not interested in sucking at present.  Mom had been doing STS on her chest which is what LC recommends.  Mom's choice is to formula and breastfeed.  Formula noted at bedside.  [place] set up DEBP after speaking with Mom regarding her decision to supplement baby.  21 flanges fitted and assisted with first pumping on initiation setting.  Reviewed how to care for pump parts, washing and drying bins provided.  Plan recommended- 1- Keep baby STS as much as possible 2- Offer the breast often with feeding cues, may help to pre-pump prior to latching 3-If baby is supplemented due to Mom's choice or difficult latch,  Mom encouraged to double pump on initiation setting for 15 mins.  Breast massage and hand express also. 4- feed any drops of colostrum to baby by finger/spoon 5- ask for help prn  Maternal Data Has patient been taught Hand Expression?: Yes Does the patient have breastfeeding experience prior to this delivery?: No  Feeding Mother's Current Feeding Choice: Breast Milk and Formula  LATCH Score Latch: Too sleepy or reluctant, no latch achieved, no sucking elicited.  Audible Swallowing: None  Type of Nipple: Flat (areolar edema noted)  Comfort (Breast/Nipple): Soft / non-tender  Hold (Positioning): Full assist, staff holds infant at breast  LATCH Score: 3   Lactation Tools  Discussed/Used Tools: Pump;Flanges Flange Size: 21 Breast pump type: Double-Electric Breast Pump Pump Education: Setup, frequency, and cleaning;Milk Storage Reason for Pumping: Support milk supply/mother's choice to breast and formula feed Pumping frequency: Mom encouraged to double pump when baby is being supplemented  Interventions Interventions: Breast feeding basics reviewed;Assisted with latch;Skin to skin;Breast massage;Hand express;Support pillows;Position options;Expressed milk;Hand pump;DEBP;LC Services brochure  Discharge Pump: Personal;DEBP (Spectra 2)  Consult Status Consult Status: Follow-up Date: 10/09/22 Follow-up type: In-patient    Broadus John 10/08/2022, 9:32 AM

## 2022-10-08 NOTE — Transfer of Care (Signed)
Immediate Anesthesia Transfer of Care Note  Patient: Maria Greer  Procedure(s) Performed: CESAREAN SECTION  Patient Location: PACU  Anesthesia Type:Regional  Level of Consciousness: awake, alert , and oriented  Airway & Oxygen Therapy: Patient Spontanous Breathing and Patient connected to nasal cannula oxygen  Post-op Assessment: Report given to RN and Post -op Vital signs reviewed and stable  Post vital signs: Reviewed and stable  Last Vitals:  Vitals Value Taken Time  BP 124/107 10/08/22 0045  Temp    Pulse 105 10/08/22 0048  Resp 20 10/08/22 0048  SpO2 100 % 10/08/22 0048  Vitals shown include unvalidated device data.  Last Pain:  Vitals:   10/07/22 1801  TempSrc: Axillary  PainSc: Asleep         Complications: No notable events documented.

## 2022-10-09 DIAGNOSIS — Z98891 History of uterine scar from previous surgery: Secondary | ICD-10-CM

## 2022-10-09 DIAGNOSIS — O139 Gestational [pregnancy-induced] hypertension without significant proteinuria, unspecified trimester: Secondary | ICD-10-CM | POA: Diagnosis not present

## 2022-10-09 DIAGNOSIS — D62 Acute posthemorrhagic anemia: Secondary | ICD-10-CM | POA: Diagnosis not present

## 2022-10-09 LAB — COMPREHENSIVE METABOLIC PANEL
ALT: 12 U/L (ref 0–44)
AST: 22 U/L (ref 15–41)
Albumin: 2.2 g/dL — ABNORMAL LOW (ref 3.5–5.0)
Alkaline Phosphatase: 67 U/L (ref 38–126)
Anion gap: 6 (ref 5–15)
BUN: 12 mg/dL (ref 6–20)
CO2: 23 mmol/L (ref 22–32)
Calcium: 8.3 mg/dL — ABNORMAL LOW (ref 8.9–10.3)
Chloride: 108 mmol/L (ref 98–111)
Creatinine, Ser: 0.89 mg/dL (ref 0.44–1.00)
GFR, Estimated: >60 mL/min (ref >60)
Glucose, Bld: 96 mg/dL (ref 70–99)
Potassium: 4.1 mmol/L (ref 3.5–5.1)
Sodium: 137 mmol/L (ref 135–145)
Total Bilirubin: 0.3 mg/dL (ref 0.3–1.2)
Total Protein: 4.9 g/dL — ABNORMAL LOW (ref 6.5–8.1)

## 2022-10-09 LAB — CBC
HCT: 21.8 % — ABNORMAL LOW (ref 36.0–46.0)
Hemoglobin: 7.1 g/dL — ABNORMAL LOW (ref 12.0–15.0)
MCH: 30.9 pg (ref 26.0–34.0)
MCHC: 32.6 g/dL (ref 30.0–36.0)
MCV: 94.8 fL (ref 80.0–100.0)
Platelets: 239 10*3/uL (ref 150–400)
RBC: 2.3 MIL/uL — ABNORMAL LOW (ref 3.87–5.11)
RDW: 14 % (ref 11.5–15.5)
WBC: 13.2 10*3/uL — ABNORMAL HIGH (ref 4.0–10.5)
nRBC: 0 % (ref 0.0–0.2)

## 2022-10-09 MED ORDER — INFLUENZA VAC SPLIT QUAD 0.5 ML IM SUSY: 0.5000 mL | PREFILLED_SYRINGE | INTRAMUSCULAR | Status: DC

## 2022-10-09 MED ORDER — MAGNESIUM OXIDE -MG SUPPLEMENT 400 (240 MG) MG PO TABS
400.0000 mg | ORAL_TABLET | Freq: Every day | ORAL | Status: DC
  Administered 2022-10-09 – 2022-10-11 (×3): 400 mg via ORAL
  Filled 2022-10-09 (×3): qty 1

## 2022-10-09 MED ORDER — POLYSACCHARIDE IRON COMPLEX 150 MG PO CAPS
150.0000 mg | ORAL_CAPSULE | Freq: Every day | ORAL | Status: DC
  Administered 2022-10-09 – 2022-10-11 (×3): 150 mg via ORAL
  Filled 2022-10-09 (×3): qty 1

## 2022-10-09 MED ORDER — IBUPROFEN 600 MG PO TABS
600.0000 mg | ORAL_TABLET | Freq: Four times a day (QID) | ORAL | Status: DC
  Administered 2022-10-09 – 2022-10-11 (×8): 600 mg via ORAL
  Filled 2022-10-09 (×8): qty 1

## 2022-10-09 MED ORDER — ACETAMINOPHEN 500 MG PO TABS
1000.0000 mg | ORAL_TABLET | Freq: Four times a day (QID) | ORAL | Status: DC
  Administered 2022-10-09 – 2022-10-11 (×8): 1000 mg via ORAL
  Filled 2022-10-09 (×8): qty 2

## 2022-10-09 NOTE — Progress Notes (Signed)
Subjective: POD# 2 Information for the patient's newborn:  Maria Greer, Maria Greer [[id number]]  female   60 Name Maria Greer Circumcision pending  Reports feeling "better" Feeding: breast Reports tolerating PO and denies N/V, foley removed, ambulating and urinating w/o difficulty  Pain controlled with  PO meds Denies HA/SOB/dizziness  Flatus passing Vaginal bleeding is normal, no clots     Objective:  VS:  Vitals:   10/08/22 1625 10/08/22 2026 10/09/22 0457 10/09/22 0551  BP: 115/70 118/73 (!) 125/94 106/76  Pulse: 74 86 92 76  Resp: 16 18    Temp: 98.2 F (36.8 C) 98.2 F (36.8 C) 97.8 F (36.6 C)   TempSrc: Oral Oral Oral   SpO2:  99% 98%   Weight:      Height:        Intake/Output Summary (Last 24 hours) at 10/09/2022 1259 Last data filed at 10/09/2022 0845 Gross per 24 hour  Intake 1375.83 ml  Output 1925 ml  Net -549.17 ml     Recent Labs    10/08/22 1656 10/09/22 0515  WBC 13.8* 13.2*  HGB 7.6* 7.1*  HCT 22.9* 21.8*  PLT 237 239    Blood type: --/--/A POS (01/12 1439) Rubella: Immune (06/20 0000)    Physical Exam:  General: alert, cooperative, and no distress CV: Regular rate and rhythm Resp: clear Abdomen: soft, nontender, normal bowel sounds Incision: clean, dry, and intact Perineum:  Uterine Fundus: firm, below umbilicus, nontender Lochia: minimal Ext: edema +2, non-pitting, neg for pain, tenderness, and cords   Assessment/Plan: 32 y.o.   POD# 2. [id number]                  Principal Problem:   Oligohydramnios Active Problems:   ABLA (acute blood loss anemia)   Postpartum care following cesarean delivery   Routine post-op PP care          Advance diet as tolerated Advised warm fluids and ambulation to improve GI motility Breastfeeding support Anticipate D/C 10/10/22 Arrie Eastern, DNP, CNM 10/09/2022, 12:59 PM

## 2022-10-09 NOTE — Progress Notes (Signed)
Provider notified about pt elevated BP 136/84 at 2117 and 133/93 at 2219, no new orders were placed.   Eartha Inch RN

## 2022-10-09 NOTE — Discharge Summary (Signed)
PCS OB Discharge Summary       Patient Name: Maria Greer DOB: 14-Oct-1990 MRN: [id number]  Date of admission: 10/06/2022 Delivering MD: Everett Graff  Date of delivery: 10/07/2022 Type of delivery: PCS  Newborn Data: Sex: Baby Female Circumcision: In pt circ prior to discharge Live born female  Birth Weight: 8 lb 15.9 oz (4080 g) APGAR: 8, 9  Newborn Delivery   Birth date/time: 10/07/2022 23:32:56 Delivery type: C-Section, Low Transverse Trial of labor: Yes C-section categorization: Primary      Feeding: breast and bottle Infant being discharge to home with mother in stable condition.   Admitting diagnosis: Oligohydramnios [O41.00X0] Intrauterine pregnancy: [redacted]w[redacted]d     Secondary diagnosis:  Principal Problem:   Oligohydramnios Active Problems:   ABLA (acute blood loss anemia)   Postpartum care following cesarean delivery   S/P cesarean section   Gestational hypertension   Postpartum hemorrhage                                Complications: None                                                              Intrapartum Procedures: cesarean: low cervical, transverse Postpartum Procedures: antibiotics and Tmax 100.7 on 1/14 @ 0254, s/p 24 hours abx.  Complications-Operative and Postpartum: hemorrhage Augmentation: AROM and Pitocin   History of Present Illness: Maria Greer is a 32 y.o. female, [id number], who presents at [redacted]w[redacted]d weeks gestation. 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 yo [id number] at [redacted]w[redacted]d was admitted to the hospital 10/06/2022 for induction of labor. Patient had a labor course significant for was addmitted on 1/12 for IOL for oligo at 39.6 weeks, HSV+, no lesions, devloped GHTN, PCR 0.14, labs elevated creatin on 1/14 1.82 then decreased to 0.89 on 1/15, asymptomatic other labs unremarkable, no meds given. Pt progressed with arom and pitocin then went for pcs with dr Mancel Bale under epidural on 1/13 @ 2332 r/t failure to progress and macrosomia. The patient went for cesarean section due to Macrosomia and Arrest of Descent. Delivery details are as follows: Membrane Rupture Time/Date: 8:37 PM ,10/06/2022   Delivery Method:C-Section, Low Transverse  Details of operation can be found in separate operative Note.  Patient had a postpartum course complicated byfever of 081.4 with s/p 24  hour abx, last temp 100.7 on 1/14, PPH with qbl of 1096, hgb drop of 10.5-7.1 asymptomatic, recived IV venofer 1/14, then continued on po iron. Had baby female the need in pt circ prior to discharge. . She is ambulating, tolerating a regular diet, passing flatus, and urinating well.  Patient is discharged home in stable condition on 10/10/22.      Newborn Data: Birth date:10/07/2022  Birth time:11:32 PM  Gender:Female  Living status:Living  Apgars:8 ,9  Weight:4080 g                               Postpartum Post Op Day # 3 :   Hospital Course--Non-Scheduled Cesarean: Patient was admitted on 1/12 for a non-scheduled Primary cesarean delivery.   She was taken to the operating room, where Dr. Mancel Bale performed a Primary LTCS under  epidural anesthesia, with delivery of a viable baby female, with weight and Apgars as listed below. Infant was in good condition and remained at the patient's bedside.  The patient was taken to recovery in good condition.  Patient planned to br/bt feed.  On post-op day 1, patient was doing well, tolerating a regular diet, with Hgb of 7.1.  Throughout her stay, her physical exam was WNL, her incision was CDI, and her vital signs remained stable.  By post-op day 2, she was up ad lib, tolerating a regular diet, with good pain control with po med.  She was deemed to have received the full benefit of her hospital stay, and was discharged home in stable condition. Pt denies HA, RUQ pain or vision changes, BP was slight elevated 133/93, and 125/92, 1+ pitting edema down her bi lateral legs, rx HCTZ for both for 7 days, pt will f/u with [facility] for 1 week follow up.  Contraceptive choice was undecided.  Denies HA RUQ pain or vision changes.    Physical exam  Vitals:   10/09/22 1435 10/09/22 2117 10/09/22 2219 10/10/22 0517  BP: (!) 128/90 136/84 (!) 133/93 116/83  Pulse: 83 96 (!) 104 85  Resp: 16 18  18   Temp: (!) 97.4 F (36.3 C)     TempSrc: Oral     SpO2: 98% 100%  (!) 9%  Weight:      Height:       General: alert, cooperative, and no distress Lochia: appropriate Uterine Fundus: firm Incision: Healing well with no significant drainage, No significant erythema, Dressing is clean, dry, and intact, honeycomb dressing CDI Perineum: Intact DVT Evaluation: No evidence of DVT seen on physical exam. 1+pitting edema bi lat legs.   Labs: Lab Results  Component Value Date   WBC 13.2 (H) 10/09/2022   HGB 7.1 (L) 10/09/2022   HCT 21.8 (L) 10/09/2022   MCV 94.8 10/09/2022   PLT 239 10/09/2022      Latest Ref Rng & Units 10/09/2022    5:15 AM  CMP  Glucose 70 - 99 mg/dL 96   BUN 6 - 20 mg/dL 12   Creatinine 0.44 - 1.00 mg/dL 0.89   Sodium 135 - 145 mmol/L 137   Potassium 3.5 - 5.1 mmol/L 4.1    Chloride 98 - 111 mmol/L 108   CO2 22 - 32 mmol/L 23   Calcium 8.9 - 10.3 mg/dL 8.3   Total Protein 6.5 - 8.1 g/dL 4.9   Total Bilirubin 0.3 - 1.2 mg/dL 0.3   Alkaline Phos 38 - 126 U/L 67   AST  15 - 41 U/L 22   ALT 0 - 44 U/L 12     Date of discharge: 10/10/2022 Discharge Diagnoses: Term Pregnancy-delivered and GHTN Discharge instruction: per After Visit Summary and "Baby and Me Booklet".  After visit meds:   Activity:           unrestricted and pelvic rest Advance as tolerated. Pelvic rest for 6 weeks.  Diet:                routine Medications: PNV, Ibuprofen, Colace, Iron, and oxy IR Postpartum contraception: Undecided Condition:  Pt discharge to home with baby in stable condition Anemia: PO Iron GHTN: 1 week BP check.   Meds: Allergies as of 10/10/2022       Reactions   Peanut Oil Itching   Shellfish Allergy Itching        Medication List     STOP taking these medications    oxyCODONE-acetaminophen 5-325 MG tablet Commonly known as: PERCOCET/ROXICET   valACYclovir 500 MG tablet Commonly known as: VALTREX       TAKE these medications    acetaminophen 500 MG tablet Commonly known as: TYLENOL Take 500 mg by mouth every 6 (six) hours as needed.   Dermoplast 20-0.5 % Aero Generic drug: benzocaine-Menthol Apply 1 Application topically 4 (four) times daily as needed for irritation.   docusate sodium 100 MG capsule Commonly known as: COLACE Take 1 capsule (100 mg total) by mouth every 12 (twelve) hours.   hydrochlorothiazide 25 MG tablet Commonly known as: HYDRODIURIL Take 1 tablet (25 mg total) by mouth daily.   hydrocortisone-pramoxine rectal foam Commonly known as: PROCTOFOAM-HC Place 1 applicator rectally 2 (two) times daily.   ibuprofen 600 MG tablet Commonly known as: ADVIL Take 1 tablet (600 mg total) by mouth every 6 (six) hours.   iron polysaccharides 150 MG capsule Commonly known as: NIFEREX Take 1 capsule (150 mg total) by mouth  daily.   oxyCODONE 5 MG immediate release tablet Commonly known as: Oxy IR/ROXICODONE Take 1 tablet (5 mg total) by mouth every 4 (four) hours as needed for moderate pain.   prenatal multivitamin Tabs tablet Take 1 tablet by mouth daily at 12 noon.        Discharge Follow Up:   Follow-up Information     [facility]. Schedule an appointment as soon as possible for a visit in 1 week(s).   Specialty: Obstetrics and Gynecology Why: 1 week BP check and 6 weeks PPV Contact information: [address] [place] [address] [place] [address] [phone]                 [facility] CNM, FNP-C, PMHNP-BC  [address]  [place], [place] [address]  Cell: [phone]  Office Phone: [phone] Fax: [phone] 10/10/2022  9:36 AM

## 2022-10-09 NOTE — Lactation Note (Signed)
This note was copied from a baby's chart. Lactation Consultation Note  Patient Name: Maria Greer [id number] Date: 10/09/2022 Reason for consult: Term;Difficult latch;Mother's request;Follow-up assessment;1st time breastfeeding Age:34 hours  P1, term female infant not latching at breast nor bottle feeding well, infant consume 2 mls of formula twice since birth.  LC suggested SLP consult due to infant being poor feeder, has uncoordinated suck , does a lot of tongue thrusting, infant was spilling formula side of mouth with purple and clear bottle and tongue thrusting , would not suckle with [place] nipple, nor take any volume from spoon.   LC had infant to suckle on glove finger ,pace feeding with 5 [place] feeding tube which took infant 50 minutes to take 9 mls of 20 kcal formula,. Infant had problems forming a seal around LC glove finger,  infant would suckle 2-3 seconds,  stop and then tongue thrust.  Infant has not voided since birth. Infant soft palate may be high and arched.  Birth Parents will continue to attempt to bottle feed infant every 8 to 12 times within 24 hours and ask RN for continue feeding support.  Birth Parent breast are feeling fuller she has a lot of areola edema in both breast.  Birth Parent used DEBP maybe twice, LC gave ice suggested she ice for 20 minutes and afterwards pump again.  LC refitted Birth Parent will a size 24 mm breast flange which she felt was more comfortable and better fit. Birth Parent will continue to pump every 3 hours for 15 minutes and give infant back any of her EBM.  Maternal Data    Feeding Mother's Current Feeding Choice: Breast Milk and Donor Milk  LATCH Score   Lactation Tools Discussed/Used    Interventions    Discharge    Consult Status Consult Status: Follow-up Date: 10/09/22 Follow-up type: In-patient    Eulis Canner 10/09/2022, 3:02 AM

## 2022-10-09 NOTE — Lactation Note (Signed)
This note was copied from a baby's chart. Lactation Consultation Note  Patient Name: Maria Greer Today's Date: 10/09/2022   Age:34 hours  LC in to see mother of term infant. Infant has been having poor feedings and SLP was consulted. SLP provided a Dr. Saul Fordyce bottle and assisted parents with bottle feeding strategies. See SLP note. Mother and father having concerns about baby's feeding and states they have been working with that all day. Mother has a DEPB in the room and has pumped once today. She states she knows to pump every 3 hrs. Praised for pumping considering her focus on other concern. Informed RN/LC can assist as needed. Mother has also received her Spectra pump via [place] service. Mother appears very tired. LC will follow up tomorrow.   Interventions    Discharge    Consult Status      Maria Greer 10/09/2022, 8:32 PM

## 2022-10-10 MED ORDER — HYDROCHLOROTHIAZIDE 25 MG PO TABS
25.0000 mg | ORAL_TABLET | Freq: Every day | ORAL | Status: DC
  Administered 2022-10-10: 25 mg via ORAL
  Filled 2022-10-10: qty 1

## 2022-10-10 MED ORDER — POLYSACCHARIDE IRON COMPLEX 150 MG PO CAPS: 150.0000 mg | ORAL_CAPSULE | Freq: Every day | ORAL | 2 refills | Status: DC

## 2022-10-10 MED ORDER — IBUPROFEN 600 MG PO TABS: 600.0000 mg | ORAL_TABLET | Freq: Four times a day (QID) | ORAL | 0 refills | Status: DC

## 2022-10-10 MED ORDER — HYDROCHLOROTHIAZIDE 25 MG PO TABS: 25.0000 mg | ORAL_TABLET | Freq: Every day | ORAL | 0 refills | Status: DC

## 2022-10-10 MED ORDER — OXYCODONE HCL 5 MG PO TABS: 5.0000 mg | ORAL_TABLET | ORAL | 0 refills | Status: DC | PRN

## 2022-10-10 NOTE — Progress Notes (Signed)
Honeycomb dressing changed per order.  Incision edges approximated with no signs or symptoms of infection at this time. New honeycomb dressing applied without difficulty.

## 2022-10-11 MED ORDER — HYDROCHLOROTHIAZIDE 50 MG PO TABS: 50.0000 mg | ORAL_TABLET | Freq: Every day | ORAL | 0 refills | Status: DC

## 2022-10-11 MED ORDER — HYDROCHLOROTHIAZIDE 50 MG PO TABS
50.0000 mg | ORAL_TABLET | Freq: Every day | ORAL | Status: DC
  Administered 2022-10-11: 50 mg via ORAL
  Filled 2022-10-11: qty 1

## 2022-10-11 NOTE — Lactation Note (Signed)
This note was copied from a baby's chart. Lactation Consultation Note  Patient Name: Maria Greer [id number] Date: 10/11/2022 Reason for consult: Follow-up assessment;Primapara;1st time breastfeeding;Difficult latch;Term Age:34 days  LC in to visit with P47 Mom of term baby delivered by C/Section.  Baby is at a 5% weight loss with good output (baby gained 25 gms from yesterday).  Baby is primarily bottle feeding and Mom has been pumping.  Her consistency in pumping is improving.  EBM in the refrigerator, last pumping expressed 30 ml.    SLP consulted as baby has had a difficult time with breast and bottle.  Mom feels much better now on day of discharge.  Encouraged STS as much as possible.  Hand's free pumping band provided with instructions on how to cue small holes to fit the flange in.  Mom very appreciative as she was not enjoying holding the pumps.    Engorgement prevention and treatment reviewed. Pump part cleaning and breast milk storage CDC guideline handouts provided.  Talked about OP lactation F/U and Mom is interested.  Message sent to clinic.  Mom aware of OP lactation support and encouraged her to call prn.   Lactation Tools Discussed/Used Tools: Pump;Flanges;Bottle;Hands-free pumping top Breast pump type: Double-Electric Breast Pump Pump Education: Setup, frequency, and cleaning;Milk Storage Reason for Pumping: Support milk supply/difficult latch to the breast Pumping frequency: every 3 hrs Pumped volume: 30 mL  Interventions Interventions: Breast feeding basics reviewed;Skin to skin;Breast massage;Hand express;DEBP;Education;Pace feeding  Discharge Discharge Education: Engorgement and breast care;Warning signs for feeding baby;Outpatient recommendation;Outpatient Epic message sent Pump: Stork Pump (Spectra)  Consult Status Consult Status: Complete Date: 10/11/22 Follow-up type: [place], [place] E 10/11/2022, 11:46 AM

## 2022-10-16 ENCOUNTER — Telehealth (HOSPITAL_COMMUNITY): Payer: Self-pay

## 2022-10-16 NOTE — Telephone Encounter (Signed)
Patient reports that she is doing great. Patient states that she saw her midwife the other day for a BP check. Patient declines questions/concerns about her health and healing.  Patient reports that baby is doing well. Baby sleeps in a bassinet. RN reviewed ABC's of safe sleep with patient. Patient declines any questions or concerns about baby.  EPDS score is 3.  Sharyn Lull [facility] 10/16/22,1924

## 2023-06-17 ENCOUNTER — Other Ambulatory Visit: Payer: Self-pay

## 2023-06-17 ENCOUNTER — Encounter: Payer: Self-pay | Admitting: *Deleted

## 2023-06-17 ENCOUNTER — Ambulatory Visit
Admission: EM | Admit: 2023-06-17 | Discharge: 2023-06-17 | Disposition: A | Discharge status: Home / Self Care | Payer: Medicaid Other | Attending: Internal Medicine | Admitting: Internal Medicine

## 2023-06-17 DIAGNOSIS — R42 Dizziness and giddiness: Secondary | ICD-10-CM | POA: Diagnosis not present

## 2023-06-17 DIAGNOSIS — H6993 Unspecified Eustachian tube disorder, bilateral: Secondary | ICD-10-CM

## 2023-06-17 MED ORDER — MECLIZINE HCL 25 MG PO TABS: 25.0000 mg | ORAL_TABLET | Freq: Three times a day (TID) | ORAL | 0 refills | Status: DC | PRN

## 2023-06-17 MED ORDER — FLUTICASONE PROPIONATE 50 MCG/ACT NA SUSP: 1.0000 | Freq: Every day | NASAL | 0 refills | Status: AC

## 2023-06-17 NOTE — Discharge Instructions (Addendum)
Start meclizine 3 times a day as needed for your dizziness.  Please of this medication can make you drowsy.  Do not drink alcohol or drive on this medication.  Start Flonase daily to help drain fluid from behind your ears that may be contributing to your dizziness.  Lots of rest and fluids.  Please follow-up with your PCP in 2 days for recheck.  Please go to the emergency room if you develop any worsening symptoms.  I hope you feel better soon!

## 2023-06-17 NOTE — ED Provider Notes (Signed)
[facility]    CSN: [id number] Arrival date & time: 06/17/23  1049      History   Chief Complaint Chief Complaint  Patient presents with   Dizziness    HPI Maria Greer is a 32 y.o. female presents for evaluation of dizziness.  Patient reports yesterday she awoke with dizziness that she feels as though the room is spinning.  It is worse with movement/position changes/head turning.  She denies any visual changes, headache, nausea/vomiting, syncope, chest pain, shortness of breath, neck pain.  Does report some "fullness" of her head and ears.  No recent URI symptoms.  No new medications.  No recent illnesses.  She is staying hydrated.  No OTC medications have been used for the symptoms.  No other concerns at this time.   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    Past Surgical History:  Procedure Laterality Date   CESAREAN SECTION N/A 10/07/2022   Procedure: CESAREAN SECTION;  Surgeon: Osborn Coho, MD;  Location: [facility] LD [facility]S;  Service: Obstetrics;  Laterality: N/A;   DIAGNOSTIC LAPAROSCOPY WITH REMOVAL OF ECTOPIC PREGNANCY Right 10/31/2019   Procedure: DIAGNOSTIC LAPAROSCOPY WITH RIGHT SALPINGECTOMY AND REMOVAL OF ECTOPIC PREGNANCY;  Surgeon: Hoover Browns, MD;  Location: [facility] OR;  Service: Gynecology;  Laterality: Right;   SALPINGECTOMY Right 10/31/2019    OB History     Gravida  3   Para  1   Term  1   Preterm      AB  2   Living  1      SAB  1   IAB      Ectopic  1    Multiple  0   Live Births  1            Home Medications    Prior to Admission medications   Medication Sig Start Date End Date Taking? Authorizing Provider  fluticasone (FLONASE) 50 MCG/ACT nasal spray Place 1 spray into both nostrils daily. 06/17/23  Yes Radford Pax, NP  meclizine (ANTIVERT) 25 MG tablet Take 1 tablet (25 mg total) by mouth 3 (three) times daily as needed for dizziness. 06/17/23  Yes Radford Pax, NP  acetaminophen (TYLENOL) 500 MG tablet Take 500 mg by mouth every 6 (six) hours as needed.    [provider]  benzocaine-Menthol (DERMOPLAST) 20-0.5 % AERO Apply 1 Application topically 4 (four) times daily as needed for irritation. 09/01/22   Henderly, Britni A, PA-C  docusate sodium (COLACE) 100 MG capsule Take 1 capsule (100 mg total) by mouth every 12 (twelve) hours. 09/01/22   Henderly, Britni A, PA-C  hydrochlorothiazide (HYDRODIURIL) 50 MG tablet Take 1 tablet (50 mg total) by mouth daily for 7 days. 10/11/22 10/18/22  Jackie Plum, MD  hydrocortisone-pramoxine [facility]) rectal foam Place 1 applicator rectally 2 (two) times daily. 09/01/22   Henderly, Britni A, PA-C  ibuprofen (ADVIL) 600 MG tablet Take 1 tablet (600 mg total) by mouth every 6 (six) hours.  10/10/22   Dale [place], FNP  iron polysaccharides (NIFEREX) 150 MG capsule Take 1 capsule (150 mg total) by mouth daily. 10/10/22   Dale Woodworth, FNP  oxyCODONE (OXY IR/ROXICODONE) 5 MG immediate release tablet Take 1 tablet (5 mg total) by mouth every 4 (four) hours as needed for moderate pain. 10/10/22   Dale , FNP  Prenatal Vit-Fe Fumarate-FA (PRENATAL MULTIVITAMIN) TABS tablet Take 1 tablet by mouth daily at 12 noon.    [provider]    Family History Family History  Problem Relation Age of Onset   Hypertension Mother    Cancer Maternal Grandmother 42       Breast    Diabetes Maternal Grandmother     Social History Social History   Tobacco Use   Smoking  status: Never   Smokeless tobacco: Never  Vaping Use   Vaping status: Never Used  Substance Use Topics   Alcohol use: Not Currently    Comment: socially   Drug use: No     Allergies   Peanut oil and Shellfish allergy   Review of Systems Review of Systems  HENT:         Ear fullness  Neurological:  Positive for dizziness.     Physical Exam Triage Vital Signs ED Triage Vitals  Encounter Vitals Group     BP 06/17/23 1058 111/76     Systolic BP Percentile --      Diastolic BP Percentile --      Pulse Rate 06/17/23 1058 73     Resp 06/17/23 1058 18     Temp 06/17/23 1058 98.2 F (36.8 C)     Temp src --      SpO2 06/17/23 1058 97 %     Weight --      Height --      Head Circumference --      Peak Flow --      Pain Score 06/17/23 1055 0     Pain Loc --      Pain Education --      Exclude from Growth Chart --    No data found.  Updated Vital Signs BP 111/76   Pulse 73   Temp 98.2 F (36.8 C)   Resp 18   LMP 06/09/2023   SpO2 97%   Visual Acuity Right Eye Distance:   Left Eye Distance:   Bilateral Distance:    Right Eye Near:   Left Eye Near:    Bilateral Near:     Physical Exam Vitals and nursing note reviewed.  Constitutional:      General: She is not in acute distress.    Appearance: Normal appearance. She is not ill-appearing.  HENT:     Head: Normocephalic and atraumatic.     Right Ear: Ear canal normal. A middle ear effusion is present. Tympanic membrane is not bulging.     Left Ear: Ear canal normal. A middle ear effusion is present. Tympanic membrane is not bulging.     Ears:     Comments: Mild effusion bilaterally without erythema    Nose: Nose normal.     Mouth/Throat:     Mouth: Mucous membranes are moist.  Eyes:     Extraocular Movements: Extraocular movements intact.     Conjunctiva/sclera: Conjunctivae normal.     Pupils: Pupils are equal, round, and reactive to light.  Cardiovascular:     Rate and Rhythm: Normal rate.   Pulmonary:     Effort: Pulmonary effort  is normal.  Skin:    General: Skin is warm and dry.  Neurological:     General: No focal deficit present.     Mental Status: She is alert and oriented to person, place, and time.     GCS: GCS eye subscore is 4. GCS verbal subscore is 5. GCS motor subscore is 6.     Cranial Nerves: No facial asymmetry.  Psychiatric:        Mood and Affect: Mood normal.        Behavior: Behavior normal.      UC Treatments / Results  Labs (all labs ordered are listed, but only abnormal results are displayed) Labs Reviewed - No data to display  EKG   Radiology No results found.  Procedures Procedures (including critical care time)  Medications Ordered in UC Medications - No data to display  Initial Impression / Assessment and Plan / UC Course  I have reviewed the triage vital signs and the nursing notes.  Pertinent labs & imaging results that were available during my care of the patient were reviewed by me and considered in my medical decision making (see chart for details).     Discussed exam and symptoms with patient.  No red flags.  Reviewed vertigo with patient.  Trial of meclizine and Flonase.  Side effect profile reviewed.  PCP follow-up 2 days for recheck.  ER precautions reviewed and patient verbalized understanding. Final Clinical Impressions(s) / UC Diagnoses   Final diagnoses:  Vertigo  Dysfunction of both eustachian tubes     Discharge Instructions      Start meclizine 3 times a day as needed for your dizziness.  Please of this medication can make you drowsy.  Do not drink alcohol or drive on this medication.  Start Flonase daily to help drain fluid from behind your ears that may be contributing to your dizziness.  Lots of rest and fluids.  Please follow-up with your PCP in 2 days for recheck.  Please go to the emergency room if you develop any worsening symptoms.  I hope you feel better soon!    ED Prescriptions      Medication Sig Dispense Auth. Provider   meclizine (ANTIVERT) 25 MG tablet Take 1 tablet (25 mg total) by mouth 3 (three) times daily as needed for dizziness. 30 tablet Radford Pax, NP   fluticasone (FLONASE) 50 MCG/ACT nasal spray Place 1 spray into both nostrils daily. 15.8 mL Radford Pax, NP      PDMP not reviewed this encounter.   Radford Pax, NP 06/17/23 [phone]

## 2023-06-17 NOTE — ED Triage Notes (Signed)
Pt reports for 2 days she has been dizzy and has head fullness.

## 2023-12-05 ENCOUNTER — Encounter (HOSPITAL_COMMUNITY): Payer: Self-pay

## 2023-12-05 ENCOUNTER — Inpatient Hospital Stay (HOSPITAL_COMMUNITY)

## 2023-12-05 ENCOUNTER — Inpatient Hospital Stay (HOSPITAL_COMMUNITY)
Admission: AD | Admit: 2023-12-05 | Discharge: 2023-12-06 | Disposition: A | Discharge status: Home / Self Care | Attending: Obstetrics and Gynecology | Admitting: Obstetrics and Gynecology

## 2023-12-05 DIAGNOSIS — O00102 Left tubal pregnancy without intrauterine pregnancy: Secondary | ICD-10-CM | POA: Diagnosis not present

## 2023-12-05 DIAGNOSIS — O209 Hemorrhage in early pregnancy, unspecified: Secondary | ICD-10-CM | POA: Diagnosis present

## 2023-12-05 DIAGNOSIS — Z3201 Encounter for pregnancy test, result positive: Secondary | ICD-10-CM | POA: Diagnosis present

## 2023-12-05 DIAGNOSIS — Z3A00 Weeks of gestation of pregnancy not specified: Secondary | ICD-10-CM | POA: Diagnosis not present

## 2023-12-05 LAB — COMPREHENSIVE METABOLIC PANEL
ALT: 17 U/L (ref 0–44)
AST: 21 U/L (ref 15–41)
Albumin: 3.9 g/dL (ref 3.5–5.0)
Alkaline Phosphatase: 54 U/L (ref 38–126)
Anion gap: 11 (ref 5–15)
BUN: 11 mg/dL (ref 6–20)
CO2: 23 mmol/L (ref 22–32)
Calcium: 9.7 mg/dL (ref 8.9–10.3)
Chloride: 103 mmol/L (ref 98–111)
Creatinine, Ser: 0.76 mg/dL (ref 0.44–1.00)
GFR, Estimated: >60 mL/min (ref >60)
Glucose, Bld: 87 mg/dL (ref 70–99)
Potassium: 3.9 mmol/L (ref 3.5–5.1)
Sodium: 137 mmol/L (ref 135–145)
Total Bilirubin: 0.4 mg/dL (ref 0.0–1.2)
Total Protein: 7.2 g/dL (ref 6.5–8.1)

## 2023-12-05 LAB — HIV ANTIBODY (ROUTINE TESTING W REFLEX): HIV Screen 4th Generation wRfx: NONREACTIVE

## 2023-12-05 LAB — HCG, QUANTITATIVE, PREGNANCY: hCG, Beta Chain, Quant, S: 8243 m[IU]/mL — ABNORMAL HIGH (ref <5)

## 2023-12-05 LAB — CBC
HCT: 36.1 % (ref 36.0–46.0)
Hemoglobin: 12.2 g/dL (ref 12.0–15.0)
MCH: 31.2 pg (ref 26.0–34.0)
MCHC: 33.8 g/dL (ref 30.0–36.0)
MCV: 92.3 fL (ref 80.0–100.0)
Platelets: 341 10*3/uL (ref 150–400)
RBC: 3.91 MIL/uL (ref 3.87–5.11)
RDW: 12.4 % (ref 11.5–15.5)
WBC: 9.6 10*3/uL (ref 4.0–10.5)
nRBC: 0 % (ref 0.0–0.2)

## 2023-12-05 LAB — URINALYSIS, ROUTINE W REFLEX MICROSCOPIC
Bacteria, UA: NONE SEEN
Bilirubin Urine: NEGATIVE
Glucose, UA: NEGATIVE mg/dL
Ketones, ur: NEGATIVE mg/dL
Leukocytes,Ua: NEGATIVE
Nitrite: NEGATIVE
Protein, ur: NEGATIVE mg/dL
Specific Gravity, Urine: 1.026 (ref 1.005–1.030)
pH: 5 (ref 5.0–8.0)

## 2023-12-05 LAB — WET PREP, GENITAL
Clue Cells Wet Prep HPF POC: NONE SEEN
Sperm: NONE SEEN
Trich, Wet Prep: NONE SEEN
WBC, Wet Prep HPF POC: <10 (ref <10)
Yeast Wet Prep HPF POC: NONE SEEN

## 2023-12-05 LAB — POCT PREGNANCY, URINE: Preg Test, Ur: POSITIVE — AB

## 2023-12-05 NOTE — MAU Provider Note (Incomplete)
 Chief Complaint: Vaginal Bleeding and Possible Pregnancy   Event Date/Time   First Provider Initiated Contact with Patient 12/05/23 2302        SUBJECTIVE HPI: Maria Greer is a 33 y.o. [id number] at Unknown by LMP who presents to maternity admissions reporting ***. She denies vaginal bleeding, vaginal itching/burning, urinary symptoms, h/a, dizziness, n/v, or fever/chills.     HPI  Past Medical History:  Diagnosis Date   Asthma    CIN I (cervical intraepithelial neoplasia I) 10/04/2012   colpo done 09/2012   Trichomonas    Yeast vaginitis    Past Surgical History:  Procedure Laterality Date   CESAREAN SECTION N/A 10/07/2022   Procedure: CESAREAN SECTION;  Surgeon: Osborn Coho, MD;  Location: [facility] LD [facility];  Service: Obstetrics;  Laterality: N/A;   DIAGNOSTIC LAPAROSCOPY WITH REMOVAL OF ECTOPIC PREGNANCY Right 10/31/2019   Procedure: DIAGNOSTIC LAPAROSCOPY WITH RIGHT SALPINGECTOMY AND REMOVAL OF ECTOPIC PREGNANCY;  Surgeon: Hoover Browns, MD;  Location: [facility] OR;  Service: Gynecology;  Laterality: Right;   SALPINGECTOMY Right 10/31/2019   Social History   Socioeconomic History   Marital status: Single    Spouse name: Not on file   Number of children: Not on file   Years of education: Not on file   Highest education level: Not on file  Occupational History   Not on file  Tobacco Use   Smoking status: Never   Smokeless tobacco: Never  Vaping Use   Vaping status: Never Used  Substance and Sexual Activity   Alcohol use: Not Currently    Comment: socially   Drug use: No   Sexual activity: Yes    Birth control/protection: Condom  Other Topics Concern   Not on file  Social History Narrative   Not on file   Social Drivers of Health   Financial Resource Strain: Low Risk  (09/05/2022)   Received from [facility], [facility]   Overall Financial Resource Strain (CARDIA)    Difficulty of Paying Living Expenses: Not very hard  Food Insecurity: No Food Insecurity  (10/06/2022)   Hunger Vital Sign    Worried About Running Out of Food in the Last Year: Never true    Ran Out of Food in the Last Year: Never true  Transportation Needs: No Transportation Needs (10/06/2022)   PRAPARE - Administrator, Civil Service (Medical): No    Lack of Transportation (Non-Medical): No  Physical Activity: Insufficiently Active (09/05/2022)   Received from [facility], [facility]   Exercise Vital Sign    Days of Exercise per Week: 2 days    Minutes of Exercise per Session: 10 min  Stress: No Stress Concern Present (09/05/2022)   Received from [facility], [facility] - Occupational Stress Questionnaire    Feeling of Stress : Not at all  Social Connections: Socially Integrated (09/05/2022)   Received from [facility], [facility]   Social Network    How would you rate your social network (family, work, friends)?: Good participation with social networks  Intimate Partner Violence: Not At Risk (10/06/2022)   Humiliation, Afraid, Rape, and Kick questionnaire    Fear of Current or Ex-Partner: No    Emotionally Abused: No    Physically Abused: No    Sexually Abused: No   No current facility-administered medications on file prior to encounter.   Current Outpatient Medications on File Prior to Encounter  Medication Sig Dispense Refill   acetaminophen (TYLENOL)  500 MG tablet Take 500 mg by mouth every 6 (six) hours as needed.     benzocaine-Menthol (DERMOPLAST) 20-0.5 % AERO Apply 1 Application topically 4 (four) times daily as needed for irritation. 78 g 0   docusate sodium (COLACE) 100 MG capsule Take 1 capsule (100 mg total) by mouth every 12 (twelve) hours. 60 capsule 0   fluticasone (FLONASE) 50 MCG/ACT nasal spray Place 1 spray into both nostrils daily. 15.8 mL 0   hydrochlorothiazide (HYDRODIURIL) 50 MG tablet Take 1 tablet (50 mg total) by mouth daily for 7 days. 7 tablet 0    hydrocortisone-pramoxine (PROCTOFOAM-HC) rectal foam Place 1 applicator rectally 2 (two) times daily. 10 g 0   ibuprofen (ADVIL) 600 MG tablet Take 1 tablet (600 mg total) by mouth every 6 (six) hours. 30 tablet 0   iron polysaccharides (NIFEREX) 150 MG capsule Take 1 capsule (150 mg total) by mouth daily. 30 capsule 2   meclizine (ANTIVERT) 25 MG tablet Take 1 tablet (25 mg total) by mouth 3 (three) times daily as needed for dizziness. 30 tablet 0   oxyCODONE (OXY IR/ROXICODONE) 5 MG immediate release tablet Take 1 tablet (5 mg total) by mouth every 4 (four) hours as needed for moderate pain. 20 tablet 0   Prenatal Vit-Fe Fumarate-FA (PRENATAL MULTIVITAMIN) TABS tablet Take 1 tablet by mouth daily at 12 noon.     Allergies  Allergen Reactions   Peanut Oil Itching   Shellfish Allergy Itching    I have reviewed patient's Past Medical Hx, Surgical Hx, Family Hx, Social Hx, medications and allergies.   ROS:  Review of Systems Review of Systems  Other systems negative   Physical Exam  Physical Exam Patient Vitals for the past 24 hrs:  BP Temp Temp src Pulse Resp SpO2 Height Weight  12/05/23 2113 121/79 98.6 F (37 C) Oral 67 18 100 % 5\' 4"  (1.626 m) 79.2 kg   Constitutional: Well-developed, well-nourished female in no acute distress.  Cardiovascular: normal rate Respiratory: normal effort GI: Abd soft, non-tender. Pos BS x 4 MS: Extremities nontender, no edema, normal ROM Neurologic: Alert and oriented x 4.  GU: Neg CVAT.  PELVIC EXAM: Cervix pink, visually closed, without lesion, scant white creamy discharge, vaginal walls and external genitalia normal Bimanual exam: Cervix 0/long/high, firm, anterior, neg CMT, uterus nontender, nonenlarged, adnexa without tenderness, enlargement, or mass  FHT *** by doppler  LAB RESULTS Results for orders placed or performed during the hospital encounter of 12/05/23 (from the past 24 hours)  Pregnancy, urine POC     Status: Abnormal    Collection Time: 12/05/23  9:23 PM  Result Value Ref Range   Preg Test, Ur POSITIVE (A) NEGATIVE  Urinalysis, Routine w reflex microscopic -Urine, Clean Catch     Status: Abnormal   Collection Time: 12/05/23  9:26 PM  Result Value Ref Range   Color, Urine YELLOW YELLOW   APPearance CLEAR CLEAR   Specific Gravity, Urine 1.026 1.005 - 1.030   pH 5.0 5.0 - 8.0   Glucose, UA NEGATIVE NEGATIVE mg/dL   Hgb urine dipstick MODERATE (A) NEGATIVE   Bilirubin Urine NEGATIVE NEGATIVE   Ketones, ur NEGATIVE NEGATIVE mg/dL   Protein, ur NEGATIVE NEGATIVE mg/dL   Nitrite NEGATIVE NEGATIVE   Leukocytes,Ua NEGATIVE NEGATIVE   RBC / HPF 6-10 0 - 5 RBC/hpf   WBC, UA 0-5 0 - 5 WBC/hpf   Bacteria, UA NONE SEEN NONE SEEN   Squamous Epithelial / HPF 0-5 0 -  5 /HPF   Mucus PRESENT   Wet prep, genital     Status: None   Collection Time: 12/05/23 10:01 PM   Specimen: Vaginal  Result Value Ref Range   Yeast Wet Prep HPF POC NONE SEEN NONE SEEN   Trich, Wet Prep NONE SEEN NONE SEEN   Clue Cells Wet Prep HPF POC NONE SEEN NONE SEEN   WBC, Wet Prep HPF POC <10 <10   Sperm NONE SEEN   CBC     Status: None   Collection Time: 12/05/23 10:08 PM  Result Value Ref Range   WBC 9.6 4.0 - 10.5 K/uL   RBC 3.91 3.87 - 5.11 MIL/uL   Hemoglobin 12.2 12.0 - 15.0 g/dL   HCT 46.9 62.9 - 52.8 %   MCV 92.3 80.0 - 100.0 fL   MCH 31.2 26.0 - 34.0 pg   MCHC 33.8 30.0 - 36.0 g/dL   RDW 41.3 24.4 - 01.0 %   Platelets 341 150 - 400 K/uL   nRBC 0.0 0.0 - 0.2 %       IMAGING No results found.  MAU Management/MDM: I have reviewed the triage vital signs and the nursing notes.   Pertinent labs & imaging results that were available during my care of the patient were reviewed by me and considered in my medical decision making (see chart for details).      I have reviewed her medical records including past results, notes and treatments. Medical, Surgical, and family history were reviewed.  Medications and recent lab tests  were reviewed  Ordered usual first trimester r/o ectopic labs.   Pelvic exam and cultures done Will check baseline Ultrasound to rule out ectopic.  Consult *** with presentation, exam findings, and results.   Treatments in MAU included ***.   This bleeding/pain can represent a normal pregnancy with bleeding, spontaneous abortion or even an ectopic which can be life-threatening.  The process as listed above helps to determine which of these is present.    ASSESSMENT No diagnosis found.  PLAN Discharge home Plan to repeat HCG level in 48 hours in clinic per 11:00 am schedule Will repeat  Ultrasound in about 7-10 days if HCG levels double appropriately  Ectopic precautions   Pt stable at time of discharge. Encouraged to return here if she develops worsening of symptoms, increase in pain, fever, or other concerning symptoms.    Wynelle Bourgeois CNM, MSN Certified Nurse-Midwife 12/05/2023  11:02 PM

## 2023-12-05 NOTE — MAU Provider Note (Signed)
 History     [id number]  Arrival date and time: 12/05/23 2047    Chief Complaint  Patient presents with   Vaginal Bleeding   Possible Pregnancy    HPI Maria Greer is a 33 y.o. at unknown gestational age with history of ruptured ectopic s/p salpingectomy presenting for vaginal bleeding and positive home UPT.   Pt started having vaginal bleeding on 3/2 that she thought was her normal period. Bleeding lasted for a week then became brown spotting that has persisted since. She was playing with her child in the yard and then had a gush of blood today prompting her to come in.   She denies any abdominal/pelvic pain. She notes severe cramping on the days that she thought she had her period but that has resolved.   She has history of ruptured ectopic pregnancy in 2021 requiring laparoscopic R salpingectomy. She had received methotrexate prior to rupture.   Hx CS 2024.   She is a patient of [facility].      Past Medical History:  Diagnosis Date   Asthma    CIN I (cervical intraepithelial neoplasia I) 10/04/2012   colpo done 09/2012   Trichomonas    Yeast vaginitis     Past Surgical History:  Procedure Laterality Date   CESAREAN SECTION N/A 10/07/2022   Procedure: CESAREAN SECTION;  Surgeon: Osborn Coho, MD;  Location: [facility] LD [facility]S;  Service: Obstetrics;  Laterality: N/A;   DIAGNOSTIC LAPAROSCOPY WITH REMOVAL OF ECTOPIC PREGNANCY Right 10/31/2019   Procedure: DIAGNOSTIC LAPAROSCOPY WITH RIGHT SALPINGECTOMY AND REMOVAL OF ECTOPIC PREGNANCY;  Surgeon: Hoover Browns, MD;  Location: [facility] OR;  Service: Gynecology;  Laterality: Right;   SALPINGECTOMY Right 10/31/2019    Family History  Problem Relation Age of Onset   Hypertension Mother    Cancer Maternal Grandmother 64       Breast    Diabetes Maternal Grandmother     Social History   Socioeconomic History   Marital status: Single    Spouse name: Not on file   Number of children: Not on file   Years of education: Not on file    Highest education level: Not on file  Occupational History   Not on file  Tobacco Use   Smoking status: Never   Smokeless tobacco: Never  Vaping Use   Vaping status: Never Used  Substance and Sexual Activity   Alcohol use: Not Currently    Comment: socially   Drug use: No   Sexual activity: Yes    Birth control/protection: Condom  Other Topics Concern   Not on file  Social History Narrative   Not on file   Social Drivers of Health   Financial Resource Strain: Low Risk  (09/05/2022)   Received from [facility], [facility]   Overall Financial Resource Strain (CARDIA)    Difficulty of Paying Living Expenses: Not very hard  Food Insecurity: No Food Insecurity (10/06/2022)   Hunger Vital Sign    Worried About Running Out of Food in the Last Year: Never true    Ran Out of Food in the Last Year: Never true  Transportation Needs: No Transportation Needs (10/06/2022)   PRAPARE - Administrator, Civil Service (Medical): No    Lack of Transportation (Non-Medical): No  Physical Activity: Insufficiently Active (09/05/2022)   Received from [facility], [facility]   Exercise Vital Sign    Days of Exercise per Week: 2 days    Minutes of Exercise per Session: 10  min  Stress: No Stress Concern Present (09/05/2022)   Received from [facility], [facility] - Occupational Stress Questionnaire    Feeling of Stress : Not at all  Social Connections: Socially Integrated (09/05/2022)   Received from [facility], [facility]   Social Network    How would you rate your social network (family, work, friends)?: Good participation with social networks  Intimate Partner Violence: Not At Risk (10/06/2022)   Humiliation, Afraid, Rape, and Kick questionnaire    Fear of Current or Ex-Partner: No    Emotionally Abused: No    Physically Abused: No    Sexually Abused: No    Allergies  Allergen Reactions   Peanut Oil Itching    Shellfish Allergy Itching    No current facility-administered medications on file prior to encounter.   Current Outpatient Medications on File Prior to Encounter  Medication Sig Dispense Refill   acetaminophen (TYLENOL) 500 MG tablet Take 500 mg by mouth every 6 (six) hours as needed.     benzocaine-Menthol (DERMOPLAST) 20-0.5 % AERO Apply 1 Application topically 4 (four) times daily as needed for irritation. 78 g 0   docusate sodium (COLACE) 100 MG capsule Take 1 capsule (100 mg total) by mouth every 12 (twelve) hours. 60 capsule 0   fluticasone (FLONASE) 50 MCG/ACT nasal spray Place 1 spray into both nostrils daily. 15.8 mL 0   hydrochlorothiazide (HYDRODIURIL) 50 MG tablet Take 1 tablet (50 mg total) by mouth daily for 7 days. 7 tablet 0   hydrocortisone-pramoxine (PROCTOFOAM-HC) rectal foam Place 1 applicator rectally 2 (two) times daily. 10 g 0   ibuprofen (ADVIL) 600 MG tablet Take 1 tablet (600 mg total) by mouth every 6 (six) hours. 30 tablet 0   iron polysaccharides (NIFEREX) 150 MG capsule Take 1 capsule (150 mg total) by mouth daily. 30 capsule 2   meclizine (ANTIVERT) 25 MG tablet Take 1 tablet (25 mg total) by mouth 3 (three) times daily as needed for dizziness. 30 tablet 0   oxyCODONE (OXY IR/ROXICODONE) 5 MG immediate release tablet Take 1 tablet (5 mg total) by mouth every 4 (four) hours as needed for moderate pain. 20 tablet 0   Prenatal Vit-Fe Fumarate-FA (PRENATAL MULTIVITAMIN) TABS tablet Take 1 tablet by mouth daily at 12 noon.     Pertinent positives and negative per HPI, all others reviewed and negative  Physical Exam   BP 121/79 (BP Location: Right Arm)   Pulse 67   Temp 98.6 F (37 C) (Oral)   Resp 18   Ht 5\' 4"  (1.626 m)   Wt 79.2 kg   LMP 11/25/2023   SpO2 100%   BMI 29.95 kg/m   Patient Vitals for the past 24 hrs:  BP Temp Temp src Pulse Resp SpO2 Height Weight  12/05/23 2113 121/79 98.6 F (37 C) Oral 67 18 100 % 5\' 4"  (1.626 m) 79.2 kg     Physical Exam Constitutional:      General: She is not in acute distress. Cardiovascular:     Rate and Rhythm: Normal rate.  Pulmonary:     Effort: Pulmonary effort is normal. No respiratory distress.  Neurological:     Mental Status: She is alert.    ***   Labs Results for orders placed or performed during the hospital encounter of 12/05/23 (from the past 24 hours)  Pregnancy, urine POC     Status: Abnormal   Collection Time: 12/05/23  9:23 PM  Result Value Ref Range   Preg Test, Ur POSITIVE (A) NEGATIVE  Urinalysis, Routine w reflex microscopic -Urine, Clean Catch     Status: Abnormal   Collection Time: 12/05/23  9:26 PM  Result Value Ref Range   Color, Urine YELLOW YELLOW   APPearance CLEAR CLEAR   Specific Gravity, Urine 1.026 1.005 - 1.030   pH 5.0 5.0 - 8.0   Glucose, UA NEGATIVE NEGATIVE mg/dL   Hgb urine dipstick MODERATE (A) NEGATIVE   Bilirubin Urine NEGATIVE NEGATIVE   Ketones, ur NEGATIVE NEGATIVE mg/dL   Protein, ur NEGATIVE NEGATIVE mg/dL   Nitrite NEGATIVE NEGATIVE   Leukocytes,Ua NEGATIVE NEGATIVE   RBC / HPF 6-10 0 - 5 RBC/hpf   WBC, UA 0-5 0 - 5 WBC/hpf   Bacteria, UA NONE SEEN NONE SEEN   Squamous Epithelial / HPF 0-5 0 - 5 /HPF   Mucus PRESENT   Wet prep, genital     Status: None   Collection Time: 12/05/23 10:01 PM   Specimen: Vaginal  Result Value Ref Range   Yeast Wet Prep HPF POC NONE SEEN NONE SEEN   Trich, Wet Prep NONE SEEN NONE SEEN   Clue Cells Wet Prep HPF POC NONE SEEN NONE SEEN   WBC, Wet Prep HPF POC <10 <10   Sperm NONE SEEN   CBC     Status: None   Collection Time: 12/05/23 10:08 PM  Result Value Ref Range   WBC 9.6 4.0 - 10.5 K/uL   RBC 3.91 3.87 - 5.11 MIL/uL   Hemoglobin 12.2 12.0 - 15.0 g/dL   HCT 29.5 28.4 - 13.2 %   MCV 92.3 80.0 - 100.0 fL   MCH 31.2 26.0 - 34.0 pg   MCHC 33.8 30.0 - 36.0 g/dL   RDW 44.0 10.2 - 72.5 %   Platelets 341 150 - 400 K/uL   nRBC 0.0 0.0 - 0.2 %  hCG, quantitative, pregnancy      Status: Abnormal   Collection Time: 12/05/23 10:08 PM  Result Value Ref Range   hCG, Beta Chain, Quant, S 8,243 (H) <5 mIU/mL  Comprehensive metabolic panel     Status: None   Collection Time: 12/05/23 10:08 PM  Result Value Ref Range   Sodium 137 135 - 145 mmol/L   Potassium 3.9 3.5 - 5.1 mmol/L   Chloride 103 98 - 111 mmol/L   CO2 23 22 - 32 mmol/L   Glucose, Bld 87 70 - 99 mg/dL   BUN 11 6 - 20 mg/dL   Creatinine, Ser 3.66 0.44 - 1.00 mg/dL   Calcium 9.7 8.9 - 44.0 mg/dL   Total Protein 7.2 6.5 - 8.1 g/dL   Albumin 3.9 3.5 - 5.0 g/dL   AST 21 15 - 41 U/L   ALT 17 0 - 44 U/L   Alkaline Phosphatase 54 38 - 126 U/L   Total Bilirubin 0.4 0.0 - 1.2 mg/dL   GFR, Estimated >34 >74 mL/min   Anion gap 11 5 - 15    Imaging US OB LESS THAN 14 WEEKS WITH OB TRANSVAGINAL Result Date: 12/05/2023 CLINICAL DATA:  Vaginal bleeding in early pregnancy. EXAM: OBSTETRIC <14 WK [place] AND TRANSVAGINAL OB US TECHNIQUE: Both transabdominal and transvaginal ultrasound examinations were performed for complete evaluation of the gestation as well as the maternal uterus, adnexal regions, and pelvic cul-de-sac. Transvaginal technique was performed to assess early pregnancy. COMPARISON:  None Available. FINDINGS: Intrauterine gestational sac: None Yolk sac:  Visualized within the left adnexal  gestational sac. Maternal uterus/adnexae: There is no endometrial gestational sac or fluid. The endometrium is thin at 7 mm. Within the left adnexa immediately abutting the left ovary is an echogenic structure containing a gestational sac and yolk sac. This measures 2.7 x 1.5 x 1.4 cm. This is immediately adjacent to the left ovary which appears normal. The right ovary is normal. Trace pelvic free fluid. IMPRESSION: 1. Ectopic pregnancy in the left adnexa, immediately abutting the left ovary, 2.7 x 1.5 x 1.4 cm. There is a gestational sac and yolk sac. 2. No intrauterine pregnancy. 3. Trace simple free fluid in the pelvic  cul-de-sac. Critical Value/emergent results were called by telephone at the time of interpretation on 12/05/2023 at 11:00 pm to provider Wynelle Bourgeois , who verbally acknowledged these results. Electronically Signed   By: Narda Rutherford M.D.   On: 12/05/2023 23:01    MAU Course  Procedures  Lab Orders         Wet prep, genital         Urinalysis, Routine w reflex microscopic -Urine, Clean Catch         CBC         hCG, quantitative, pregnancy         HIV Antibody (routine testing w rflx)         Comprehensive metabolic panel         Pregnancy, urine POC    No orders of the defined types were placed in this encounter.   Imaging Orders         US OB LESS THAN 14 WEEKS WITH OB TRANSVAGINAL     MDM Moderate 11:33p Diagnosed by [facility] in [place] on [place] today. hCG 8243. Diagnosis reviewed with patient. Reviewed  Assessment and Plan  32yo 12/1019 with left ectopic pregnancy      #FWB FHT Cat *** NST: ***    Lennart Pall, MD 12/05/23 11:49 PM  Allergies as of 12/05/2023       Reactions   Peanut Oil Itching   Shellfish Allergy Itching     Med Rec must be completed prior to using this [facility]***

## 2023-12-05 NOTE — MAU Note (Signed)
..  Maria Greer is a 33 y.o. at Unknown here in MAU reporting: vaginal bleeding that began 11/25/2023 thought it was her period, and every since then she has had intermittent brown discharge to gushes of blood. Had a positive pregnancy test today.  Denies pain.  LMP: 11/25/2023? Pain score: 0/10 Vitals:   12/05/23 2113  BP: 121/79  Pulse: 67  Resp: 18  Temp: 98.6 F (37 C)  SpO2: 100%     FHT:n/a Lab orders placed from triage:  POCT pregnancy test

## 2023-12-06 DIAGNOSIS — O00102 Left tubal pregnancy without intrauterine pregnancy: Secondary | ICD-10-CM

## 2023-12-06 DIAGNOSIS — Z3A00 Weeks of gestation of pregnancy not specified: Secondary | ICD-10-CM

## 2023-12-06 LAB — GC/CHLAMYDIA PROBE AMP (~~LOC~~) NOT AT ARMC
Chlamydia: NEGATIVE
Comment: NEGATIVE
Comment: NORMAL
Neisseria Gonorrhea: NEGATIVE

## 2023-12-06 MED ORDER — METHOTREXATE FOR ECTOPIC PREGNANCY
50.0000 mg/m2 | Freq: Once | INTRAMUSCULAR | Status: AC
  Administered 2023-12-06: 95 mg via INTRAMUSCULAR
  Filled 2023-12-06: qty 3.8

## 2023-12-09 ENCOUNTER — Inpatient Hospital Stay (HOSPITAL_COMMUNITY)
Admission: AD | Admit: 2023-12-09 | Discharge: 2023-12-09 | Disposition: A | Discharge status: Home / Self Care | Payer: Self-pay | Attending: Obstetrics and Gynecology | Admitting: Obstetrics and Gynecology

## 2023-12-09 DIAGNOSIS — O00102 Left tubal pregnancy without intrauterine pregnancy: Secondary | ICD-10-CM | POA: Insufficient documentation

## 2023-12-09 DIAGNOSIS — Z3A00 Weeks of gestation of pregnancy not specified: Secondary | ICD-10-CM

## 2023-12-09 LAB — HCG, QUANTITATIVE, PREGNANCY: hCG, Beta Chain, Quant, S: 13510 m[IU]/mL — ABNORMAL HIGH (ref <5)

## 2023-12-09 NOTE — Discharge Instructions (Signed)
Return to care  If you have heavier bleeding that soaks through more than 2 pads per hour for an hour or more If you bleed so much that you feel like you might pass out or you do pass out If you have significant abdominal pain that is not improved with Tylenol   

## 2023-12-09 NOTE — MAU Note (Signed)
.  Maria Greer is a 33 y.o. at Unknown here in MAU for follow up hcg lab, day 4 following methotrexate.  Denies pain today, reports bleeding remains the same.  Pt reports brown spotting.     Pain score: 0 Vitals:   12/09/23 1314 12/09/23 1315  BP:  114/69  Pulse:  80  Resp:  16  Temp:  98.7 F (37.1 C)  SpO2: 100%      Lab orders placed from triage: hcg

## 2023-12-09 NOTE — MAU Provider Note (Signed)
 History   Chief Complaint:  Follow-up   Maria Greer is a 33 y.o. [id number] at Unknown who presents for labs. She is day 4 s/p methotrexate for ectopic pregnancy. Denies abdominal pain. Reports continued vaginal bleeding; not saturating pads.   Physical Exam   Blood pressure 114/69, pulse 80, temperature 98.7 F (37.1 C), temperature source Oral, resp. rate 16, last menstrual period 11/25/2023, SpO2 100%, unknown if currently breastfeeding.  Physical Examination: General appearance - alert, well appearing, and in no distress Mental status - alert, oriented to person, place, and time, normal mood, behavior, speech, dress, motor activity, and thought processes Eyes - pupils equal and reactive, extraocular eye movements intact, sclera anicteric Chest - normal respiratory effort  Labs: Results for orders placed or performed during the hospital encounter of 12/09/23 (from the past 24 hours)  hCG, quantitative, pregnancy   Collection Time: 12/09/23  1:19 PM  Result Value Ref Range   hCG, Beta Chain, Quant, S 13,510 (H) <5 mIU/mL    Ultrasound Studies:   No results found.  Assessment:   1. Left tubal pregnancy without intrauterine pregnancy     Component     Latest Ref Rng 12/05/2023 12/09/2023  HCG, Beta Chain, Quant, S     <5 mIU/mL 8,243 (H) MTX given  13,510 (H)  Day 4     Plan: -Discharge home in stable condition -Patient asymptomatic & stable vitals. Reviewed Day 4 HCG with Dr. Earlene Plater. Will bring back for day 7 -Patient will return to MAU on Wednesday for day 7 lab due to high likelihood of repeat MTX dosing. Reviewed ruptured ectopic precautions & reasons to return to MAU -[facility] provider on call updated with information to facilitate follow up.    Judeth Horn, NP 12/09/2023, 7:12 PM

## 2023-12-12 ENCOUNTER — Inpatient Hospital Stay (HOSPITAL_COMMUNITY): Admit: 2023-12-12 | Discharge: 2023-12-12 | Discharge status: Home / Self Care | Attending: Obstetrics and Gynecology

## 2023-12-12 ENCOUNTER — Inpatient Hospital Stay (HOSPITAL_COMMUNITY)
Admission: AD | Admit: 2023-12-12 | Discharge: 2023-12-12 | Disposition: A | Discharge status: Home / Self Care | Attending: Obstetrics and Gynecology | Admitting: Obstetrics and Gynecology

## 2023-12-12 ENCOUNTER — Inpatient Hospital Stay (HOSPITAL_COMMUNITY)

## 2023-12-12 DIAGNOSIS — O008 Other ectopic pregnancy without intrauterine pregnancy: Secondary | ICD-10-CM | POA: Diagnosis present

## 2023-12-12 DIAGNOSIS — Z3A00 Weeks of gestation of pregnancy not specified: Secondary | ICD-10-CM | POA: Insufficient documentation

## 2023-12-12 DIAGNOSIS — O4690 Antepartum hemorrhage, unspecified, unspecified trimester: Secondary | ICD-10-CM | POA: Diagnosis not present

## 2023-12-12 DIAGNOSIS — O00202 Left ovarian pregnancy without intrauterine pregnancy: Secondary | ICD-10-CM | POA: Diagnosis not present

## 2023-12-12 LAB — CBC WITH DIFFERENTIAL/PLATELET
Abs Immature Granulocytes: 0.01 10*3/uL (ref 0.00–0.07)
Basophils Absolute: 0 10*3/uL (ref 0.0–0.1)
Basophils Relative: 1 %
Eosinophils Absolute: 0.1 10*3/uL (ref 0.0–0.5)
Eosinophils Relative: 1 %
HCT: 35.1 % — ABNORMAL LOW (ref 36.0–46.0)
Hemoglobin: 12.1 g/dL (ref 12.0–15.0)
Immature Granulocytes: 0 %
Lymphocytes Relative: 45 %
Lymphs Abs: 2.7 10*3/uL (ref 0.7–4.0)
MCH: 32 pg (ref 26.0–34.0)
MCHC: 34.5 g/dL (ref 30.0–36.0)
MCV: 92.9 fL (ref 80.0–100.0)
Monocytes Absolute: 0.4 10*3/uL (ref 0.1–1.0)
Monocytes Relative: 6 %
Neutro Abs: 2.9 10*3/uL (ref 1.7–7.7)
Neutrophils Relative %: 47 %
Platelets: 312 10*3/uL (ref 150–400)
RBC: 3.78 MIL/uL — ABNORMAL LOW (ref 3.87–5.11)
RDW: 12.4 % (ref 11.5–15.5)
WBC: 6.1 10*3/uL (ref 4.0–10.5)
nRBC: 0 % (ref 0.0–0.2)

## 2023-12-12 LAB — COMPREHENSIVE METABOLIC PANEL
ALT: 13 U/L (ref 0–44)
AST: 17 U/L (ref 15–41)
Albumin: 3.8 g/dL (ref 3.5–5.0)
Alkaline Phosphatase: 41 U/L (ref 38–126)
Anion gap: 11 (ref 5–15)
BUN: 8 mg/dL (ref 6–20)
CO2: 22 mmol/L (ref 22–32)
Calcium: 8.8 mg/dL — ABNORMAL LOW (ref 8.9–10.3)
Chloride: 105 mmol/L (ref 98–111)
Creatinine, Ser: 0.78 mg/dL (ref 0.44–1.00)
GFR, Estimated: >60 mL/min (ref >60)
Glucose, Bld: 86 mg/dL (ref 70–99)
Potassium: 3.8 mmol/L (ref 3.5–5.1)
Sodium: 138 mmol/L (ref 135–145)
Total Bilirubin: 0.6 mg/dL (ref 0.0–1.2)
Total Protein: 7 g/dL (ref 6.5–8.1)

## 2023-12-12 LAB — HCG, QUANTITATIVE, PREGNANCY: hCG, Beta Chain, Quant, S: 12787 m[IU]/mL — ABNORMAL HIGH (ref <5)

## 2023-12-12 MED ORDER — METHOTREXATE FOR ECTOPIC PREGNANCY
50.0000 mg/m2 | Freq: Once | INTRAMUSCULAR | Status: AC
  Administered 2023-12-12: 95 mg via INTRAMUSCULAR
  Filled 2023-12-12: qty 3.8

## 2023-12-12 NOTE — MAU Note (Signed)
.  Maria Greer is a 33 y.o. at Unknown here in MAU reporting: Here for repeat beta hcg for day 7 labs post Methotrexate injection for left ectopic pregnancy. Denies pain and vaginal bleeding. She reports she has felt "twinges" on the left side of her pelvis but reports they are not painful.  Labs: 3/12 : 8243 3/16 : 13510  3/12 Imaging: 1. Ectopic pregnancy in the left adnexa, immediately abutting the left ovary, 2.7 x 1.5 x 1.4 cm. There is a gestational sac and yolk sac. 2. No intrauterine pregnancy. 3. Trace simple free fluid in the pelvic cul-de-sac.  Pain score: Denies pain.  Vitals:   12/12/23 0944  BP: 111/71  Pulse: 75  Resp: 16  Temp: 98.8 F (37.1 C)  SpO2: 100%     Lab orders placed from triage: hcg quantitative

## 2023-12-12 NOTE — MAU Provider Note (Signed)
 History     CSN: [id number]  Arrival date and time: 12/12/23 5621   Event Date/Time   First Provider Initiated Contact with Patient 12/12/2023  9:43 AM   Chief Complaint  Patient presents with   Follow-up    HPI  Maria Greer is a 33 y.o. [id number] at Unknown who presents to the MAU for repeat b-hCG. Pt with prior right ectopic pregnancy, failed methotrexate therapy and ruptured requiring right salpingectomy. Diagnosed with left ectopic pregnancy on 3/12, given Methotrexate. She reports vaginal bleeding (initial presenting complaint) seems to have resolved and pt has very faint/trace pain today. No other new complaints. She would like to avoid surgery if at all possible.  Past Medical History:  Diagnosis Date   Asthma    CIN I (cervical intraepithelial neoplasia I) 10/04/2012   colpo done 09/2012   Trichomonas    Yeast vaginitis     Past Surgical History:  Procedure Laterality Date   CESAREAN SECTION N/A 10/07/2022   Procedure: CESAREAN SECTION;  Surgeon: Osborn Coho, MD;  Location: [facility] LD ORS;  Service: Obstetrics;  Laterality: N/A;   DIAGNOSTIC LAPAROSCOPY WITH REMOVAL OF ECTOPIC PREGNANCY Right 10/31/2019   Procedure: DIAGNOSTIC LAPAROSCOPY WITH RIGHT SALPINGECTOMY AND REMOVAL OF ECTOPIC PREGNANCY;  Surgeon: Hoover Browns, MD;  Location: [facility] OR;  Service: Gynecology;  Laterality: Right;   SALPINGECTOMY Right 10/31/2019    Family History  Problem Relation Age of Onset   Hypertension Mother    Cancer Maternal Grandmother 20       Breast    Diabetes Maternal Grandmother     Social History   Tobacco Use   Smoking status: Never   Smokeless tobacco: Never  Vaping Use   Vaping status: Never Used  Substance Use Topics   Alcohol use: Not Currently    Comment: socially   Drug use: No    Allergies:  Allergies  Allergen Reactions   Peanut Oil Itching   Shellfish Allergy Itching    Medications Prior to Admission  Medication Sig Dispense Refill Last Dose/Taking    acetaminophen (TYLENOL) 500 MG tablet Take 500 mg by mouth every 6 (six) hours as needed.      benzocaine-Menthol (DERMOPLAST) 20-0.5 % AERO Apply 1 Application topically 4 (four) times daily as needed for irritation. 78 g 0    docusate sodium (COLACE) 100 MG capsule Take 1 capsule (100 mg total) by mouth every 12 (twelve) hours. 60 capsule 0    fluticasone (FLONASE) 50 MCG/ACT nasal spray Place 1 spray into both nostrils daily. 15.8 mL 0    hydrocortisone-pramoxine (PROCTOFOAM-HC) rectal foam Place 1 applicator rectally 2 (two) times daily. 10 g 0    iron polysaccharides (NIFEREX) 150 MG capsule Take 1 capsule (150 mg total) by mouth daily. 30 capsule 2    meclizine (ANTIVERT) 25 MG tablet Take 1 tablet (25 mg total) by mouth 3 (three) times daily as needed for dizziness. 30 tablet 0     ROS reviewed and pertinent positives and negatives as documented in HPI.  Physical Exam   Blood pressure 111/71, pulse 75, temperature 98.8 F (37.1 C), temperature source Oral, resp. rate 16, height 5\' 4"  (1.626 m), weight 79.6 kg, last menstrual period 11/25/2023, SpO2 100%, unknown if currently breastfeeding.  Physical Exam Constitutional:      General: She is not in acute distress.    Appearance: Normal appearance. She is not ill-appearing.  HENT:     Head: Normocephalic and atraumatic.  Cardiovascular:  Rate and Rhythm: Normal rate.  Pulmonary:     Effort: Pulmonary effort is normal.     Breath sounds: Normal breath sounds.  Abdominal:     Palpations: Abdomen is soft.     Tenderness: There is no abdominal tenderness. There is no guarding.  Musculoskeletal:        General: Normal range of motion.  Skin:    General: Skin is warm and dry.     Findings: No rash.  Neurological:     General: No focal deficit present.     Mental Status: She is alert and oriented to person, place, and time.     MAU Course  Procedures  MDM 33 y.o. [id number] here for day 7 labs after Methotrexate for left  ectopic pregnancy. Her b-hCG has not decreased by the expected 15%. Her exam is benign and her vitals are stable.    1353 Patient discussed with Dr. Su Hilt -- rec repeat U/S to eval for chances of success with second dose Methotrexate. Will order.  1543 U/S overall stable from previous. Discussed with Dr. Su Hilt who reviewed chart. Will give second dose Methotrexate. Discussed plan of care with patient, who is amenable to repeat dose. Return precautions discussed.  Assessment and Plan  Ectopic pregnancy of left ovary  B-hCG change <15% day 4 to 7, imaging stable Methotrexate dose #2 administered 12/12/23   The risks of methotrexate were reviewed including failure requiring repeat dosing or eventual surgery. She understands that methotrexate involves frequent return visits to monitor lab values and that she remains at risk of ectopic rupture until her beta is less than assay. ?The patient opts to proceed with methotrexate.  She has no history of hepatic or renal dysfunction, has normal BUN/Cr/LFT's/platelets.  She is felt to be reliable for follow-up. Side effects of photosensitivity & GI upset were discussed.  She knows to avoid direct sunlight and abstain from alcohol, NSAIDs and sexual intercourse for two weeks. She was counseled to discontinue any MVI with folic acid. ?She understands to follow up on D4 (3/22) and D7 (3/25) for repeat BHCG and was given the instruction sheet. ?Strict ectopic precautions were reviewed, the patient knows to call with any abdominal pain, vomiting, fainting, or any concerns with her health.  Day 0/1 Day 4 Day 7  Sunday Wednesday Saturday  Monday Thursday Sunday  Tuesday Friday Monday  Wednesday Saturday Tuesday  Thursday Sunday Wednesday  Friday Monday Thursday  Saturday Tuesday Friday      Sundra Aland, MD OB Fellow, Faculty Practice [place], [facility]  12/12/2023, 4:04 PM

## 2023-12-15 ENCOUNTER — Other Ambulatory Visit (HOSPITAL_COMMUNITY)

## 2023-12-16 ENCOUNTER — Encounter (HOSPITAL_COMMUNITY): Payer: Self-pay | Admitting: Obstetrics and Gynecology

## 2023-12-16 ENCOUNTER — Inpatient Hospital Stay (HOSPITAL_COMMUNITY)
Admission: AD | Admit: 2023-12-16 | Discharge: 2023-12-16 | Disposition: A | Discharge status: Home / Self Care | Attending: Family Medicine | Admitting: Family Medicine

## 2023-12-16 DIAGNOSIS — O00102 Left tubal pregnancy without intrauterine pregnancy: Secondary | ICD-10-CM | POA: Diagnosis not present

## 2023-12-16 DIAGNOSIS — Z3201 Encounter for pregnancy test, result positive: Secondary | ICD-10-CM | POA: Diagnosis present

## 2023-12-16 DIAGNOSIS — Z3A00 Weeks of gestation of pregnancy not specified: Secondary | ICD-10-CM

## 2023-12-16 DIAGNOSIS — O209 Hemorrhage in early pregnancy, unspecified: Secondary | ICD-10-CM | POA: Diagnosis present

## 2023-12-16 DIAGNOSIS — O00109 Unspecified tubal pregnancy without intrauterine pregnancy: Secondary | ICD-10-CM

## 2023-12-16 LAB — HCG, QUANTITATIVE, PREGNANCY: hCG, Beta Chain, Quant, S: 7405 m[IU]/mL — ABNORMAL HIGH (ref <5)

## 2023-12-16 NOTE — MAU Provider Note (Signed)
 History   Chief Complaint:  Follow-up   Maria Greer is  33 y.o. [id number] Patient's last menstrual period was 11/25/2023. Patient is here for follow up of quantitative HCG and ongoing surveillance of pregnancy status. She is Unknown weeks gestation by LMP.    Since her last visit, the patient is without new complaint. The patient reports bleeding as  similar to period.  She denies any pain.  General ROS:  positive for vaginal bleeding  Her previous Quantitative HCG values are:  Recent Labs  Lab 12/05/23 2208 12/09/23 1319 12/12/23 0949  HCGBETAQNT 8,243* 13,510* 12,787*    Physical Exam   Blood pressure 117/62, pulse 72, temperature 98.5 F (36.9 C), temperature source Oral, resp. rate 15, weight 79.5 kg, last menstrual period 11/25/2023, SpO2 100%, unknown if currently breastfeeding.  Focused Gynecological Exam: examination not indicated  Labs: Results for orders placed or performed during the hospital encounter of 12/16/23 (from the past 24 hours)  hCG, quantitative, pregnancy   Collection Time: 12/16/23  9:44 AM  Result Value Ref Range   hCG, Beta Chain, Quant, S 7,405 (H) <5 mIU/mL    Ultrasound Studies:   US OB Transvaginal Result Date: 12/12/2023 CLINICAL DATA:  Ectopic pregnancy. EXAM: TRANSVAGINAL OB ULTRASOUND TECHNIQUE: Transvaginal ultrasound was performed for complete evaluation of the gestation as well as the maternal uterus, adnexal regions, and pelvic cul-de-sac. COMPARISON:  12/05/2023. FINDINGS: Intrauterine gestational sac: Absent. Yolk sac:  Absent. Embryo:  Absent. Cardiac Activity: Absent. Maternal uterus/adnexae: Ovaries are unremarkable. An anechoic lesion with increased through transmission and thickened wall is seen in the left adnexa, adjacent to the left ovary, measuring 2.3 x 1.6 x 1.8 cm. There is associated color flow. Findings are similar to 12/05/2023. No free fluid. IMPRESSION: Persistent left adnexal ectopic pregnancy. No free fluid to suggest  rupture. Electronically Signed   By: Leanna Battles M.D.   On: 12/12/2023 14:28   US OB LESS THAN 14 WEEKS WITH OB TRANSVAGINAL Result Date: 12/05/2023 CLINICAL DATA:  Vaginal bleeding in early pregnancy. EXAM: OBSTETRIC <14 WK [place] AND TRANSVAGINAL OB US TECHNIQUE: Both transabdominal and transvaginal ultrasound examinations were performed for complete evaluation of the gestation as well as the maternal uterus, adnexal regions, and pelvic cul-de-sac. Transvaginal technique was performed to assess early pregnancy. COMPARISON:  None Available. FINDINGS: Intrauterine gestational sac: None Yolk sac:  Visualized within the left adnexal gestational sac. Maternal uterus/adnexae: There is no endometrial gestational sac or fluid. The endometrium is thin at 7 mm. Within the left adnexa immediately abutting the left ovary is an echogenic structure containing a gestational sac and yolk sac. This measures 2.7 x 1.5 x 1.4 cm. This is immediately adjacent to the left ovary which appears normal. The right ovary is normal. Trace pelvic free fluid. IMPRESSION: 1. Ectopic pregnancy in the left adnexa, immediately abutting the left ovary, 2.7 x 1.5 x 1.4 cm. There is a gestational sac and yolk sac. 2. No intrauterine pregnancy. 3. Trace simple free fluid in the pelvic cul-de-sac. Critical Value/emergent results were called by telephone at the time of interpretation on 12/05/2023 at 11:00 pm to provider Wynelle Bourgeois , who verbally acknowledged these results. Electronically Signed   By: Narda Rutherford M.D.   On: 12/05/2023 23:01    Assessment:   1. Tubal pregnancy without intrauterine pregnancy, unspecified laterality     Plan: -Discharge home in stable condition -Ectopic precautions discussed -Patient advised to follow-up with Tuesday 12/18/2023 -Advised that [facility] will need to follow-up HCG  weekly until reaches zero AFTER Tuesday HCG is drawn -Patient may return to MAU as needed or if her condition were to change or  worsen  Maria Greer, CNM 12/16/2023, 11:05 AM

## 2023-12-16 NOTE — MAU Note (Signed)
.  Maria Greer is a 33 y.o. at Unknown here in MAU for follow up hcg.  Pt denies pain, reports she is still bleeding.      Pain score: 0 Vitals:   12/16/23 0932 12/16/23 0933  BP:  117/62  Pulse:  72  Resp:  15  Temp:  98.5 F (36.9 C)  SpO2: 100%       Lab orders placed from triage: hcg

## 2023-12-18 ENCOUNTER — Inpatient Hospital Stay (HOSPITAL_COMMUNITY)
Admission: AD | Admit: 2023-12-18 | Discharge: 2023-12-18 | Disposition: A | Discharge status: Home / Self Care | Payer: Self-pay | Attending: Obstetrics & Gynecology | Admitting: Obstetrics & Gynecology

## 2023-12-18 ENCOUNTER — Encounter: Payer: Self-pay | Admitting: Nurse Practitioner

## 2023-12-18 DIAGNOSIS — O3680X0 Pregnancy with inconclusive fetal viability, not applicable or unspecified: Secondary | ICD-10-CM | POA: Diagnosis present

## 2023-12-18 DIAGNOSIS — R7989 Other specified abnormal findings of blood chemistry: Secondary | ICD-10-CM

## 2023-12-18 DIAGNOSIS — Z3A00 Weeks of gestation of pregnancy not specified: Secondary | ICD-10-CM

## 2023-12-18 LAB — HCG, QUANTITATIVE, PREGNANCY: hCG, Beta Chain, Quant, S: 5912 m[IU]/mL — ABNORMAL HIGH (ref <5)

## 2023-12-18 NOTE — Progress Notes (Signed)
 PC/placed to [facility] ( Dr Sallye Ober - on call Physician)  - Advised Dr Sallye Ober that patient having difficulty scheduling appointments for follow up hcg levels s/p methotrexate on 3/12 & 12/12/23. Seen in [place] today with results hcg appropriate decline 7405 (12/16/23) -> 5912 (12/18/23) today  and Dr Sallye Ober will assure patient receives a phone call from the office for her f/u hcg testing weekly.  Marcell Barlow, MSN, [facility], [facility] [employer]

## 2023-12-18 NOTE — Discharge Instructions (Signed)
 Follow up with your OB weekly until hcg levels are 0

## 2023-12-18 NOTE — MAU Note (Addendum)
 Maria Greer is a 33 y.o. at Unknown here in MAU reporting: here for day 7 follow up after second dose of Methotrexate.  Denies pain, just feels tired.  Bleeding has picked up some, but still kind of light.  Onset of complaint: ongoing Pain score: none Vitals:   12/18/23 0954  BP: 99/70  Pulse: 76  Resp: 16  Temp: 98.9 F (37.2 C)  SpO2: 100%      Lab orders placed from triage:  HCG drawn in triage

## 2023-12-18 NOTE — MAU Provider Note (Addendum)
     S Ms. JERLISA DILIBERTO is a 33 y.o. [phone] patient who presents to MAU today with LMP 11/25/23 who presents today for follow up hcg  s/p methotrexate treatment on 3/12 & 12/12/23. She denies any abdominal pain and reports scant bleeding. Her OB /GYN is [facility] and she has previously been advised to f/u with them for weekly HCG levels until it reaches 0.    Review of Systems  Constitutional:  Negative for chills and fever.  Gastrointestinal:  Negative for abdominal pain, nausea and vomiting.  All other systems reviewed and are negative.  Unless noted in HPI   O LMP 11/25/2023    Physical Exam Vitals and nursing note reviewed.  Constitutional:      General: She is not in acute distress.    Appearance: Normal appearance. She is not ill-appearing.  HENT:     Head: Normocephalic.     Mouth/Throat:     Mouth: Mucous membranes are moist.  Cardiovascular:     Rate and Rhythm: Normal rate.  Pulmonary:     Effort: Pulmonary effort is normal.     Breath sounds: Normal breath sounds.  Abdominal:     Palpations: Abdomen is soft.  Musculoskeletal:        General: Normal range of motion.     Cervical back: Normal range of motion.  Skin:    General: Skin is warm.  Neurological:     Mental Status: She is alert and oriented to person, place, and time.  Psychiatric:        Behavior: Behavior normal.     Lab Results  Component Value Date   HCGBETAQNT 5,912 (H) 12/18/2023   HCGBETAQNT 7,405 (H) 12/16/2023   HCGBETAQNT 12,787 (H) 12/12/2023   HCGBETAQNT 13,510 (H) 12/09/2023   HCGBETAQNT 8,243 (H) 12/05/2023   HCGBETAQNT 170 (H) 01/27/2022   HCGBETAQNT 9,340 (H) 10/31/2019      MDM MODERATE   I have reviewed the patient chart and performed the physical exam . I have ordered & interpreted the lab results and discussed the findings with the patient  Medications ordered as stated below.  A/P as described below.  Counseling and education provided and patient agreeable  with plan as  described below. Verbalized understanding.    ASSESSMENT Medical screening exam complete   Orders Placed This Encounter  Procedures   hCG, quantitative, pregnancy    Standing Status:   Standing    Number of Occurrences:   1      PLAN Discharge from MAU in stable condition HCG sufficient drop from 12/16/23 F/U with [facility] weekly for HCG levels -> 0, patient verbalized understanding of needing to make these appointments today   Warning signs for worsening condition that would warrant emergency follow-up discussed Patient may return to MAU as needed   Colman Cater, NP 12/18/2023 9:48 AM

## 2024-04-11 ENCOUNTER — Emergency Department (HOSPITAL_BASED_OUTPATIENT_CLINIC_OR_DEPARTMENT_OTHER)
Admission: EM | Admit: 2024-04-11 | Discharge: 2024-04-11 | Disposition: A | Discharge status: Home / Self Care | Source: Ambulatory Visit | Attending: Emergency Medicine | Admitting: Emergency Medicine

## 2024-04-11 ENCOUNTER — Emergency Department (HOSPITAL_BASED_OUTPATIENT_CLINIC_OR_DEPARTMENT_OTHER): Admitting: Radiology

## 2024-04-11 ENCOUNTER — Encounter (HOSPITAL_BASED_OUTPATIENT_CLINIC_OR_DEPARTMENT_OTHER): Payer: Self-pay

## 2024-04-11 ENCOUNTER — Other Ambulatory Visit: Payer: Self-pay

## 2024-04-11 DIAGNOSIS — R0789 Other chest pain: Secondary | ICD-10-CM | POA: Diagnosis present

## 2024-04-11 DIAGNOSIS — R079 Chest pain, unspecified: Secondary | ICD-10-CM

## 2024-04-11 LAB — CBC
HCT: 36.5 % (ref 36.0–46.0)
Hemoglobin: 12.5 g/dL (ref 12.0–15.0)
MCH: 31.9 pg (ref 26.0–34.0)
MCHC: 34.2 g/dL (ref 30.0–36.0)
MCV: 93.1 fL (ref 80.0–100.0)
Platelets: 329 K/uL (ref 150–400)
RBC: 3.92 MIL/uL (ref 3.87–5.11)
RDW: 12.1 % (ref 11.5–15.5)
WBC: 8 K/uL (ref 4.0–10.5)
nRBC: 0 % (ref 0.0–0.2)

## 2024-04-11 LAB — PREGNANCY, URINE: Preg Test, Ur: NEGATIVE

## 2024-04-11 LAB — TROPONIN T, HIGH SENSITIVITY: Troponin T High Sensitivity: <15 ng/L (ref <19)

## 2024-04-11 LAB — BASIC METABOLIC PANEL WITH GFR
Anion gap: 12 (ref 5–15)
BUN: 13 mg/dL (ref 6–20)
CO2: 22 mmol/L (ref 22–32)
Calcium: 9.5 mg/dL (ref 8.9–10.3)
Chloride: 105 mmol/L (ref 98–111)
Creatinine, Ser: 0.78 mg/dL (ref 0.44–1.00)
GFR, Estimated: >60 mL/min (ref >60)
Glucose, Bld: 95 mg/dL (ref 70–99)
Potassium: 3.8 mmol/L (ref 3.5–5.1)
Sodium: 139 mmol/L (ref 135–145)

## 2024-04-11 MED ORDER — KETOROLAC TROMETHAMINE 15 MG/ML IJ SOLN
15.0000 mg | Freq: Once | INTRAMUSCULAR | Status: AC
  Administered 2024-04-11: 15 mg via INTRAVENOUS
  Filled 2024-04-11: qty 1

## 2024-04-11 NOTE — ED Triage Notes (Signed)
 Pt reports substernal chest pressure x2 days. Pt went to UC and was sent to ED for labs. Pt denies any SOB or N/V. Pt denies any increase in pain with inspiration or movement. Pt reports feeling like heart is racing.

## 2024-04-11 NOTE — ED Provider Notes (Signed)
 [place] EMERGENCY DEPARTMENT AT [facility] Provider Note   CSN: [id number] Arrival date & time: 04/11/24  1514     Patient presents with: Chest Pain   Maria Greer is a 33 y.o. female.  Patient with noncontributory past medical history reports to emergency room with complaint of chest pain.  Patient has noted some central chest discomfort for 2 days which seems to be about constant.  Denies any aggravating or alleviating symptoms.  Did note some palpitations when these initially started that lasted a few seconds but then it resolved.  She does not notice any exertional component to this.  She denies shortness of breath nausea vomiting cough or fever.  No swelling in the lower extremities.  She has no history of DVT or PE.  No recent travel.  She is on birth control.    Chest Pain      Prior to Admission medications   Medication Sig Start Date End Date Taking? Authorizing Provider  acetaminophen  (TYLENOL ) 500 MG tablet Take 500 mg by mouth every 6 (six) hours as needed.    [provider]  fluticasone  (FLONASE ) 50 MCG/ACT nasal spray Place 1 spray into both nostrils daily. 06/17/23   Mayer, Jodi R, NP  meclizine  (ANTIVERT ) 25 MG tablet Take 1 tablet (25 mg total) by mouth 3 (three) times daily as needed for dizziness. 06/17/23   Mayer, Jodi R, NP    Allergies: Peanut oil and Shellfish allergy    Review of Systems  Cardiovascular:  Positive for chest pain.    Updated Vital Signs BP (!) 127/106 (BP Location: Right Arm)   Pulse 80   Temp 98.6 F (37 C) (Oral)   Resp 18   Ht 5' 5 (1.651 m)   Wt 82.6 kg   LMP 03/28/2024   SpO2 100%   Breastfeeding Unknown   BMI 30.29 kg/m   Physical Exam Vitals and nursing note reviewed.  Constitutional:      General: She is not in acute distress.    Appearance: She is not toxic-appearing.  HENT:     Head: Normocephalic and atraumatic.  Eyes:     General: No scleral icterus.    Conjunctiva/sclera:  Conjunctivae normal.  Cardiovascular:     Rate and Rhythm: Normal rate and regular rhythm.     Pulses: Normal pulses.     Heart sounds: Normal heart sounds.  Pulmonary:     Effort: Pulmonary effort is normal. No respiratory distress.     Breath sounds: Normal breath sounds.  Abdominal:     General: Abdomen is flat. Bowel sounds are normal.     Palpations: Abdomen is soft.     Tenderness: There is no abdominal tenderness.  Skin:    General: Skin is warm and dry.     Findings: No lesion.  Neurological:     General: No focal deficit present.     Mental Status: She is alert and oriented to person, place, and time. Mental status is at baseline.     (all labs ordered are listed, but only abnormal results are displayed) Labs Reviewed  BASIC METABOLIC PANEL WITH GFR  CBC  PREGNANCY, URINE  TROPONIN T, HIGH SENSITIVITY    EKG: EKG Interpretation Date/Time:  Friday April 11 2024 15:22:37 EDT Ventricular Rate:  77 PR Interval:  128 QRS Duration:  76 QT Interval:  374 QTC Calculation: 423 R Axis:   64  Text Interpretation: Normal sinus rhythm Normal ECG No previous ECGs available Confirmed by  Ruthe Cornet [phone]) on 04/11/2024 3:39:11 PM  Radiology: DG Chest 2 View Result Date: 04/11/2024 CLINICAL DATA:  Chest pain for 2 days. EXAM: CHEST - 2 VIEW COMPARISON:  None Available. FINDINGS: The heart size and mediastinal contours are within normal limits. Both lungs are clear. The visualized skeletal structures are unremarkable. IMPRESSION: No active cardiopulmonary disease. Electronically Signed   By: Lynwood Landy Raddle M.D.   On: 04/11/2024 16:17     Procedures   Medications Ordered in the ED - No data to display                                  Medical Decision Making Amount and/or Complexity of Data Reviewed Labs: ordered. Radiology: ordered.   Maria Greer 33 y.o. presented today for chest pain. Working DDx that I considered at this time includes, but not limited to,  ACS, GERD, pe, pna, aortic dissection, pneumothorax, MSK path, anemia, esophageal rupture, CHF exacerbation, valvular disorder, myocarditis, pericarditis, endocarditis, pericardial effusion/cardiac tamponade, pulmonary edema, gastritis/PUD, esophagitis.  R/o Dx: These are considered less likely due to history of present illness and physical exam findings.  PE: advanced imaging will not be ordered as alternative diagnoses are more likely at this time Aortic Dissection: less likely based on the history of present illness - location, quality, onset, and severity of symptoms in this case.   Unique Tests and My Interpretation:  EKG: Rate, rhythm, axis, intervals all examined: Normal sinus Troponin: Under 15 CXR: No acute pathology CBC: CBC with no leukocytosis no anemia BMP: No electrolyte abnormality, normal kidney function.  Pregnancy test negative.  Problem List / ED Course / Critical interventions / Medication management  Patient reports to emergency room with complaint of chest pain.  Chest pain is constant and has no exertional component to it.  She has no family history of known heart attack or heart disease, no family history of sudden cardiac death.  Her EKG is normal sinus.  She has negative troponin here thus doubt ACS.  Chest x-ray shows no sign of pneumonia or pneumothorax.  She is no sign of volume overload on exam, no shortness of breath and no congestion on chest x-ray.  Symptoms are not consistent with aortic dissection.  She is PERC negative thus doubt PE as cause of pains.  Labs were normal here, vital signs normal here with no hypoxia.  Overall reassuring labs.  Feel patient is stable for discharge.  Given return precautions Patients vitals assessed. Upon arrival patient is hemodynamically stable.  I have reviewed the patients home medicines and have         Final diagnoses:  Chest pain, unspecified type    ED Discharge Orders     None          Shermon Warren SAILOR,  PA-C 04/11/24 1635    Ruthe Cornet, DO 04/11/24 2024

## 2024-04-11 NOTE — Discharge Instructions (Addendum)
 Please call primary care to schedule follow-up for recheck of symptoms.  If you have recurrence of palpitations you could consider getting a ZIO monitor placed with primary care to rule out arrhythmia.  EKG, chest x-ray and lab work was normal today.  Return to ER with worsening symptoms.

## 2024-04-11 NOTE — ED Notes (Signed)
 Reviewed discharge instructions and follow up care with pt. Pt verbalized understanding and had no further questions. Pt exited ED without complications.

## 2024-10-21 ENCOUNTER — Ambulatory Visit (HOSPITAL_COMMUNITY): Payer: Self-pay | Admitting: Student

## 2024-10-21 ENCOUNTER — Encounter (HOSPITAL_COMMUNITY): Payer: Self-pay | Admitting: Obstetrics and Gynecology

## 2024-10-21 ENCOUNTER — Other Ambulatory Visit (HOSPITAL_COMMUNITY): Payer: Self-pay | Admitting: Student

## 2024-10-21 ENCOUNTER — Inpatient Hospital Stay (HOSPITAL_COMMUNITY)
Admission: AD | Admit: 2024-10-21 | Discharge: 2024-10-21 | Disposition: A | Discharge status: Home / Self Care | Attending: Obstetrics and Gynecology | Admitting: Obstetrics and Gynecology

## 2024-10-21 ENCOUNTER — Inpatient Hospital Stay (HOSPITAL_COMMUNITY)

## 2024-10-21 DIAGNOSIS — Z3A01 Less than 8 weeks gestation of pregnancy: Secondary | ICD-10-CM

## 2024-10-21 DIAGNOSIS — B3731 Acute candidiasis of vulva and vagina: Secondary | ICD-10-CM

## 2024-10-21 DIAGNOSIS — O00102 Left tubal pregnancy without intrauterine pregnancy: Secondary | ICD-10-CM

## 2024-10-21 LAB — COMPREHENSIVE METABOLIC PANEL WITH GFR
ALT: 17 U/L (ref 0–44)
AST: 19 U/L (ref 15–41)
Albumin: 4.3 g/dL (ref 3.5–5.0)
Alkaline Phosphatase: 62 U/L (ref 38–126)
Anion gap: 14 (ref 5–15)
BUN: 11 mg/dL (ref 6–20)
CO2: 20 mmol/L — ABNORMAL LOW (ref 22–32)
Calcium: 9.3 mg/dL (ref 8.9–10.3)
Chloride: 102 mmol/L (ref 98–111)
Creatinine, Ser: 0.68 mg/dL (ref 0.44–1.00)
GFR, Estimated: >60 mL/min
Glucose, Bld: 103 mg/dL — ABNORMAL HIGH (ref 70–99)
Potassium: 4 mmol/L (ref 3.5–5.1)
Sodium: 136 mmol/L (ref 135–145)
Total Bilirubin: 0.4 mg/dL (ref 0.0–1.2)
Total Protein: 7.5 g/dL (ref 6.5–8.1)

## 2024-10-21 LAB — URINALYSIS, ROUTINE W REFLEX MICROSCOPIC
Bilirubin Urine: NEGATIVE
Glucose, UA: NEGATIVE mg/dL
Hgb urine dipstick: NEGATIVE
Ketones, ur: NEGATIVE mg/dL
Leukocytes,Ua: NEGATIVE
Nitrite: NEGATIVE
Protein, ur: NEGATIVE mg/dL
Specific Gravity, Urine: 1.021 (ref 1.005–1.030)
pH: 6 (ref 5.0–8.0)

## 2024-10-21 LAB — WET PREP, GENITAL
Clue Cells Wet Prep HPF POC: NONE SEEN
Sperm: NONE SEEN
Trich, Wet Prep: NONE SEEN
WBC, Wet Prep HPF POC: >=10 — AB

## 2024-10-21 LAB — CBC
HCT: 36.9 % (ref 36.0–46.0)
Hemoglobin: 12.7 g/dL (ref 12.0–15.0)
MCH: 31.8 pg (ref 26.0–34.0)
MCHC: 34.4 g/dL (ref 30.0–36.0)
MCV: 92.3 fL (ref 80.0–100.0)
Platelets: 378 10*3/uL (ref 150–400)
RBC: 4 MIL/uL (ref 3.87–5.11)
RDW: 11.6 % (ref 11.5–15.5)
WBC: 9 10*3/uL (ref 4.0–10.5)
nRBC: 0 % (ref 0.0–0.2)

## 2024-10-21 LAB — HCG, QUANTITATIVE, PREGNANCY: hCG, Beta Chain, Quant, S: 3691 m[IU]/mL — ABNORMAL HIGH

## 2024-10-21 LAB — POCT PREGNANCY, URINE: Preg Test, Ur: POSITIVE — AB

## 2024-10-21 MED ORDER — FLUCONAZOLE 150 MG PO TABS: 150.0000 mg | ORAL_TABLET | Freq: Every day | ORAL | 1 refills | Status: DC

## 2024-10-21 MED ORDER — METHOTREXATE FOR ECTOPIC PREGNANCY
50.0000 mg/m2 | Freq: Once | INTRAMUSCULAR | Status: AC
  Administered 2024-10-21: 97.5 mg via INTRAMUSCULAR
  Filled 2024-10-21: qty 3.9

## 2024-10-21 NOTE — MAU Provider Note (Signed)
 " History     CSN: [id number]  Arrival date and time: 10/21/24 1131   Event Date/Time   First Provider Initiated Contact with Patient 10/21/24 1438      Chief Complaint  Patient presents with   Vaginal Bleeding   HPI Maria Greer is a 34 y.o. [id number] at [redacted]w[redacted]d who presents with vaginal spotting. Reports pink spotting on toilet paper since this morning. Denies abdominal pain or vaginal discharge. Hx of ectopic pregnancy x 2. Has appt with [facility] this afternoon but when called office instructed to come to MAU due to symptoms.   OB History     Gravida  5   Para  1   Term  1   Preterm      AB  3   Living  1      SAB  1   IAB      Ectopic  2   Multiple  0   Live Births  1           Past Medical History:  Diagnosis Date   Asthma    CIN I (cervical intraepithelial neoplasia I) 10/04/2012   colpo done 09/2012   Trichomonas    Yeast vaginitis     Past Surgical History:  Procedure Laterality Date   CESAREAN SECTION N/A 10/07/2022   Procedure: CESAREAN SECTION;  Surgeon: Henry Slough, MD;  Location: [facility] LD ORS;  Service: Obstetrics;  Laterality: N/A;   DIAGNOSTIC LAPAROSCOPY WITH REMOVAL OF ECTOPIC PREGNANCY Right 10/31/2019   Procedure: DIAGNOSTIC LAPAROSCOPY WITH RIGHT SALPINGECTOMY AND REMOVAL OF ECTOPIC PREGNANCY;  Surgeon: Gloriann Chick, MD;  Location: [facility] OR;  Service: Gynecology;  Laterality: Right;   SALPINGECTOMY Right 10/31/2019    Family History  Problem Relation Age of Onset   Hypertension Mother    Cancer Maternal Grandmother 48       Breast    Diabetes Maternal Grandmother     Social History[1]  Allergies: Allergies[2]  No medications prior to admission.    Review of Systems Physical Exam   Blood pressure 113/78, pulse 78, temperature 99.1 F (37.3 C), resp. rate 18, height 5' 5 (1.651 m), weight 82 kg, last menstrual period 09/16/2024, SpO2 100%, unknown if currently breastfeeding.  Physical Exam Vitals and nursing note reviewed.   Constitutional:      General: She is not in acute distress.    Appearance: She is well-developed. She is not ill-appearing.  HENT:     Head: Normocephalic and atraumatic.  Eyes:     General: No scleral icterus.       Right eye: No discharge.        Left eye: No discharge.     Conjunctiva/sclera: Conjunctivae normal.  Pulmonary:     Effort: Pulmonary effort is normal. No respiratory distress.  Neurological:     General: No focal deficit present.     Mental Status: She is alert.  Psychiatric:        Mood and Affect: Mood normal.        Behavior: Behavior normal.     MAU Course  Procedures Results for orders placed or performed during the hospital encounter of 10/21/24 (from the past 24 hours)  Urinalysis, Routine w reflex microscopic -Urine, Clean Catch     Status: None   Collection Time: 10/21/24 11:52 AM  Result Value Ref Range   Color, Urine YELLOW YELLOW   APPearance CLEAR CLEAR   Specific Gravity, Urine 1.021 1.005 - 1.030   pH 6.0 5.0 -  8.0   Glucose, UA NEGATIVE NEGATIVE mg/dL   Hgb urine dipstick NEGATIVE NEGATIVE   Bilirubin Urine NEGATIVE NEGATIVE   Ketones, ur NEGATIVE NEGATIVE mg/dL   Protein, ur NEGATIVE NEGATIVE mg/dL   Nitrite NEGATIVE NEGATIVE   Leukocytes,Ua NEGATIVE NEGATIVE  Wet prep, genital     Status: Abnormal   Collection Time: 10/21/24 11:52 AM   Specimen: PATH Cytology Cervicovaginal Ancillary Only  Result Value Ref Range   Yeast Wet Prep HPF POC PRESENT (A) NONE SEEN   Trich, Wet Prep NONE SEEN NONE SEEN   Clue Cells Wet Prep HPF POC NONE SEEN NONE SEEN   WBC, Wet Prep HPF POC >=10 (A) <10   Sperm NONE SEEN   Pregnancy, urine POC     Status: Abnormal   Collection Time: 10/21/24 11:55 AM  Result Value Ref Range   Preg Test, Ur POSITIVE (A) NEGATIVE  CBC     Status: None   Collection Time: 10/21/24 12:38 PM  Result Value Ref Range   WBC 9.0 4.0 - 10.5 K/uL   RBC 4.00 3.87 - 5.11 MIL/uL   Hemoglobin 12.7 12.0 - 15.0 g/dL   HCT 63.0 63.9  - 53.9 %   MCV 92.3 80.0 - 100.0 fL   MCH 31.8 26.0 - 34.0 pg   MCHC 34.4 30.0 - 36.0 g/dL   RDW 88.3 88.4 - 84.4 %   Platelets 378 150 - 400 K/uL   nRBC 0.0 0.0 - 0.2 %  hCG, quantitative, pregnancy     Status: Abnormal   Collection Time: 10/21/24 12:38 PM  Result Value Ref Range   hCG, Beta Chain, Quant, S 3,691 (H) <5 mIU/mL  Comprehensive metabolic panel     Status: Abnormal   Collection Time: 10/21/24 12:38 PM  Result Value Ref Range   Sodium 136 135 - 145 mmol/L   Potassium 4.0 3.5 - 5.1 mmol/L   Chloride 102 98 - 111 mmol/L   CO2 20 (L) 22 - 32 mmol/L   Glucose, Bld 103 (H) 70 - 99 mg/dL   BUN 11 6 - 20 mg/dL   Creatinine, Ser 9.31 0.44 - 1.00 mg/dL   Calcium 9.3 8.9 - 89.6 mg/dL   Total Protein 7.5 6.5 - 8.1 g/dL   Albumin  4.3 3.5 - 5.0 g/dL   AST 19 15 - 41 U/L   ALT 17 0 - 44 U/L   Alkaline Phosphatase 62 38 - 126 U/L   Total Bilirubin 0.4 0.0 - 1.2 mg/dL   GFR, Estimated >39 >39 mL/min   Anion gap 14 5 - 15   US  OB LESS THAN 14 WEEKS WITH OB TRANSVAGINAL Result Date: 10/21/2024 EXAM: ULTRASOUND FIRST TRIMESTER TECHNIQUE: Transabdominal and Transvaginal first trimester obstetric pelvic duplex ultrasound was performed with real-time imaging, color flow Doppler imaging, and spectral analysis. COMPARISON: None available. CLINICAL HISTORY: Vaginal bleeding in pregnancy, first trimester. FINDINGS: UTERUS: No focal myometrial mass. GESTATIONAL SAC(S): No intrauterine gestational sac. YOLK SAC: A yolk sac is identified within a thick-walled cystic structure in the left adnexa. EMBRYO(<11WK) /FETUS(>=11WK): No fetal pole or fetal heartbeat present at this time. CROWN RUMP LENGTH: Not measured. RATE OF CARDIAC ACTIVITY: Not present. RIGHT OVARY: Normal appearance of the right ovary, which measures 3.1 x 2.6 x 2.1 cm. LEFT OVARY: The left ovary measures 3.6 x 2.5 x 2.3 cm. In the left adnexa, separate from the left ovary, there is a thick-walled cystic structure measuring 1.7 x 1.5 x  1.6 cm, containing a yolk sac.  No fetal pole or fetal heartbeat present at this time. FREE FLUID: Small volume of simple-appearing free fluid in the pelvis. MEASUREMENTS ESTIMATED GESTATIONAL AGE BY CURRENT ULTRASOUND: Not applicable, as no intrauterine gestational sac or measurable embryo is identified. ESTIMATED GESTATIONAL AGE BY LMP/PRIOR ULTRASOUND: Not provided. ESTIMATED DUE DATE: Not applicable. IMPRESSION: 1. Left adnexal ectopic pregnancy containing a yolk sac. No fetal pole or cardiac activity, at this time. Small volume of simple appearing free fluid in the pelvis. 2. Critical value results were called by telephone at the time of interpretation on 10/21/2024 at 1:16 PM to provider Rocky Satterfield NP, who verbally acknowledged these results. Electronically signed by: rogelia myers MD 10/21/2024 01:37 PM EST RP Workstation: [id number]    MDM   Assessment and Plan   1. Left tubal pregnancy without intrauterine pregnancy   2. [redacted] weeks gestation of pregnancy    -Ultrasound shows left adnexal mass consistent with ectopic pregnancy measuring 1.7 cm. No evidence of rupture. HCG today is 3691. Patient stable. Hx of salpingectomy with ruptured ectopic in 2021. Left ectopic tx last year with methotrexate . Patient would like future pregnancy. Discussed results with patient & Dr. Henry - will give methotrexate .  -Patient to return to MAU for Day 4 & 7 labs per Dr. Henry.  -Reviewed s/s ruptured ectopic & reasons to return to MAU  The risks of methotrexate  were reviewed including failure requiring repeat dosing or eventual surgery. She understands that methotrexate  involves frequent return visits to monitor lab values and that she remains at risk of ectopic rupture until her beta is less than assay. ?The patient opts to proceed with methotrexate .  She has no history of hepatic or renal dysfunction, has normal BUN/Cr/LFT's/platelets.  She is felt to be reliable for follow-up. Side effects of  photosensitivity & GI upset were discussed.  She knows to avoid direct sunlight and abstain from alcohol, aspirin and aspirin-like products for two weeks. She was counseled to discontinue any MVI with folic acid. ?She understands to follow up on D4 (Friday) and D7 (Monday) for repeat BHCG and was given the instruction sheet. ?Strict ectopic precautions were reviewed, the patient knows to call with any abdominal pain, vomiting, fainting, or any concerns with her health.  Rh+, no Rhogam necessary.    Rocky Satterfield 10/21/2024, 7:06 PM      [1]  Social History Tobacco Use   Smoking status: Never   Smokeless tobacco: Never  Vaping Use   Vaping status: Never Used  Substance Use Topics   Alcohol use: Not Currently    Comment: socially   Drug use: No  [2]  Allergies Allergen Reactions   Peanut Oil Itching   Shellfish Allergy Itching   "

## 2024-10-21 NOTE — MAU Note (Signed)
 Maria Greer is a 34 y.o. at Unknown here in MAU reporting: started having spotting  on Sunday. Goes from pink to brown. No pain . Pt has had 2 ectopic pregnancies in the past and is concerned.   LMP: 09/16/2024 Onset of complaint: Sunday Pain score: 0 Vitals:   10/21/24 1206  BP: 109/73  Pulse: 97  Resp: 18  Temp: 99.1 F (37.3 C)     FHT: n/a  Lab orders placed from triage: wet prep. G/c u/a

## 2024-10-22 LAB — GC/CHLAMYDIA PROBE AMP (~~LOC~~) NOT AT ARMC
Chlamydia: NEGATIVE
Comment: NEGATIVE
Comment: NORMAL
Neisseria Gonorrhea: NEGATIVE

## 2024-10-24 ENCOUNTER — Inpatient Hospital Stay (HOSPITAL_COMMUNITY): Admit: 2024-10-24 | Discharge: 2024-10-24 | Disposition: A | Attending: Obstetrics and Gynecology

## 2024-10-24 ENCOUNTER — Inpatient Hospital Stay (HOSPITAL_COMMUNITY)
Admission: AD | Admit: 2024-10-24 | Discharge: 2024-10-24 | Disposition: A | Discharge status: Home / Self Care | Payer: Self-pay | Attending: Obstetrics and Gynecology | Admitting: Obstetrics and Gynecology

## 2024-10-24 DIAGNOSIS — O00202 Left ovarian pregnancy without intrauterine pregnancy: Secondary | ICD-10-CM | POA: Insufficient documentation

## 2024-10-24 DIAGNOSIS — Z3A00 Weeks of gestation of pregnancy not specified: Secondary | ICD-10-CM | POA: Diagnosis not present

## 2024-10-24 DIAGNOSIS — N939 Abnormal uterine and vaginal bleeding, unspecified: Secondary | ICD-10-CM | POA: Diagnosis not present

## 2024-10-24 LAB — COMPREHENSIVE METABOLIC PANEL WITH GFR
ALT: 21 U/L (ref 0–44)
AST: 26 U/L (ref 15–41)
Albumin: 4.3 g/dL (ref 3.5–5.0)
Alkaline Phosphatase: 61 U/L (ref 38–126)
Anion gap: 13 (ref 5–15)
BUN: 12 mg/dL (ref 6–20)
CO2: 21 mmol/L — ABNORMAL LOW (ref 22–32)
Calcium: 9.4 mg/dL (ref 8.9–10.3)
Chloride: 103 mmol/L (ref 98–111)
Creatinine, Ser: 0.71 mg/dL (ref 0.44–1.00)
GFR, Estimated: >60 mL/min
Glucose, Bld: 87 mg/dL (ref 70–99)
Potassium: 4.1 mmol/L (ref 3.5–5.1)
Sodium: 137 mmol/L (ref 135–145)
Total Bilirubin: 0.5 mg/dL (ref 0.0–1.2)
Total Protein: 7.3 g/dL (ref 6.5–8.1)

## 2024-10-24 LAB — HCG, QUANTITATIVE, PREGNANCY: hCG, Beta Chain, Quant, S: 7744 m[IU]/mL — ABNORMAL HIGH

## 2024-10-24 MED ORDER — CYCLOBENZAPRINE HCL 10 MG PO TABS: 10.0000 mg | ORAL_TABLET | Freq: Two times a day (BID) | ORAL | 0 refills | Status: AC | PRN

## 2024-10-24 MED ORDER — CYCLOBENZAPRINE HCL 10 MG PO TABS: 10.0000 mg | ORAL_TABLET | Freq: Two times a day (BID) | ORAL | 0 refills | Status: DC | PRN

## 2024-10-24 MED ORDER — METHOTREXATE FOR ECTOPIC PREGNANCY
50.0000 mg/m2 | Freq: Once | INTRAMUSCULAR | Status: AC
  Administered 2024-10-24: 97.5 mg via INTRAMUSCULAR
  Filled 2024-10-24: qty 3.9

## 2024-10-24 NOTE — MAU Provider Note (Cosign Needed Addendum)
 CC/ S/P methotrexate  for D4 HCG follow up     S/HPI [name] is a 34 y.o. [phone] patient who presents to MAU today with complaint of she is here for her day for hCG levels status post methotrexate  received on 10/21/2024.  Patient reports she is having slight abdominal cramping last night which has resolved today and slight vaginal bleeding.  Denies taking any medication for pain and has her follow-up appointment on day 7 for HCG level.     Objective BP 115/69   Pulse 80   Temp 98.9 F (37.2 C)   Resp 18   LMP 09/16/2024  Physical Exam Vitals and nursing note reviewed.  Constitutional:      General: She is not in acute distress.    Appearance: Normal appearance. She is not ill-appearing.  HENT:     Head: Normocephalic.     Nose: Nose normal.     Mouth/Throat:     Mouth: Mucous membranes are moist.  Cardiovascular:     Rate and Rhythm: Normal rate.  Pulmonary:     Effort: Pulmonary effort is normal.  Abdominal:     General: There is no distension.     Palpations: Abdomen is soft.     Tenderness: There is no abdominal tenderness. There is no guarding or rebound.  Musculoskeletal:        General: Normal range of motion.     Cervical back: Normal range of motion.  Skin:    General: Skin is warm.  Neurological:     Mental Status: She is alert and oriented to person, place, and time.  Psychiatric:        Mood and Affect: Mood normal.        Behavior: Behavior normal.     MDM  MODERATE   Orders Placed This Encounter  Procedures   hCG, quantitative, pregnancy    Standing Status:   Standing    Number of Occurrences:   1   Comprehensive metabolic panel with GFR    STAT    Standing Status:   Standing    Number of Occurrences:   1    Meds ordered this encounter  Medications   methotrexate  (for ectopic pregnancy) 25 mg/mL chemo injection    Methotrexate  for ectopic pregnancy administration and follow up after administration must take place at [facility] and  [facility] or [facility].  Has the patient been instructed to travel to one of these locations or has a transfer been facilitated:   Yes     Results for orders placed or performed during the hospital encounter of 10/24/24 (from the past 24 hours)  hCG, quantitative, pregnancy     Status: Abnormal   Collection Time: 10/24/24 11:51 AM  Result Value Ref Range   hCG, Beta Chain, Quant, S 7,744 (H) <5 mIU/mL    Lab Results  Component Value Date   HCGBETAQNT 7,744 (H) 10/24/2024   HCGBETAQNT 3,691 (H) 10/21/2024   HCGBETAQNT 5,912 (H) 12/18/2023   HCGBETAQNT 7,405 (H) 12/16/2023    @ 1405/ Discussed with OB Attending will plan for 2nd dose of methotrexate  and restart protocol and reviewed table with patient. Patient agreeable to 2nd dose of methotrexate  today.   CMP added to the labs methotrexate  ordered from pharmacy  @ 1736/ There was a delay in processing of CMP by lab , patient notified and methotrexate  will be released as soon as CMP results. Pharmacy notified of delay  in lab processing.  The risks of methotrexate  were reviewed including failure requiring repeat dosing or eventual surgery. She understands that methotrexate  involves frequent return visits to monitor lab values and that she remains at risk of ectopic rupture until her beta is less than assay. ?The patient opts to proceed with methotrexate .  She has no history of hepatic or renal dysfunction, has normal BUN/Cr/LFT's/platelets.  She is felt to be reliable for follow-up. Side effects of photosensitivity & GI upset were discussed.  She knows to avoid direct sunlight and abstain from alcohol, NSAIDs and sexual intercourse for two weeks. She was counseled to discontinue any MVI with folic acid.  ?She understands to follow up on D4 (2/2) and D7 (2/5) for repeat BHCG and was given the instruction sheet.  ?Strict ectopic precautions were reviewed, the patient knows to call with any abdominal pain, vomiting, fainting,  or any concerns with her health.  Day 0/1 Day 4 Day 7  Sunday Wednesday Saturday  Monday Thursday Sunday  Tuesday Friday Monday  Wednesday Saturday Tuesday  Thursday Sunday Wednesday  Friday Monday Thursday  Saturday Tuesday Friday       ASSESSMENT/PLAN Medical screening exam complete  Ectopic pregnancy of left ovary S/P methotrexate  1/27 D4 HCG doubled to 7,774 2nd Dose of methotrexate  today with S/R/B and precautions  Table given Patient to return to MAU on D4 and D7 for stat  HCG levels per ( Dr [facility] Private Attending)  Vaginal bleeding S/P methotrexate  10/21/24    Discharge from MAU in stable condition  See AVS for full description of educational information and instructions provided to the patient at time of discharge   Warning signs for worsening condition that would warrant emergency follow-up discussed  Patient may return to MAU as needed  ----------------------------------------------------------------  Olam Dalton, MSN, [facility], Center for [facility]

## 2024-10-24 NOTE — Discharge Instructions (Signed)
" °?  She understands to follow up on D4 (2/2) and D7 (2/5) for repeat BHCG and was given the instruction sheet. ?Strict ectopic precautions were reviewed, the patient knows to call with any abdominal pain, vomiting, fainting, or any concerns with her health.  Day 0/1 Day 4 Day 7  Sunday Wednesday Saturday  Monday Thursday Sunday  Tuesday Friday Monday  Wednesday Saturday Tuesday  Thursday Sunday Wednesday  Friday Monday Thursday  Saturday Tuesday Friday    "

## 2024-10-24 NOTE — MAU Note (Signed)
 Here for day 4 methotrexate   labs. Reports she is having a little more pan and discomfort that she was a few days ago. Reports bleeding a little more than spotting. Pain 4-5/10.  BP 115/69   Pulse 80   Temp 98.9 F (37.2 C)   Resp 18   LMP 09/16/2024

## 2024-10-27 ENCOUNTER — Other Ambulatory Visit (HOSPITAL_COMMUNITY): Payer: Self-pay

## 2024-10-27 ENCOUNTER — Other Ambulatory Visit: Payer: Self-pay

## 2024-10-27 ENCOUNTER — Inpatient Hospital Stay (HOSPITAL_COMMUNITY)
Admit: 2024-10-27 | Discharge: 2024-10-27 | Disposition: A | Payer: Self-pay | Attending: Obstetrics and Gynecology | Admitting: Obstetrics and Gynecology

## 2024-10-27 DIAGNOSIS — R7989 Other specified abnormal findings of blood chemistry: Secondary | ICD-10-CM

## 2024-10-27 DIAGNOSIS — O0281 Inappropriate change in quantitative human chorionic gonadotropin (hCG) in early pregnancy: Secondary | ICD-10-CM | POA: Insufficient documentation

## 2024-10-27 DIAGNOSIS — O00102 Left tubal pregnancy without intrauterine pregnancy: Secondary | ICD-10-CM | POA: Insufficient documentation

## 2024-10-27 DIAGNOSIS — Z3A01 Less than 8 weeks gestation of pregnancy: Secondary | ICD-10-CM

## 2024-10-27 LAB — HCG, QUANTITATIVE, PREGNANCY: hCG, Beta Chain, Quant, S: 7313 m[IU]/mL — ABNORMAL HIGH

## 2024-10-27 NOTE — Discharge Instructions (Signed)
  Day 0/1 Day 4 Day 7  Sunday Wednesday Saturday  Monday Thursday Sunday  Tuesday Friday Monday  Wednesday Saturday Tuesday  Thursday Sunday Wednesday  Friday Monday Thursday  Saturday Tuesday Friday

## 2024-10-27 NOTE — MAU Provider Note (Incomplete)
 " History     CSN: [id number]  Arrival date and time: 10/27/24 1231   Event Date/Time   First Provider Initiated Contact with Patient 10/27/24 1445      Chief Complaint  Patient presents with   HCG Level   Maria Greer presents to MAU for follow-up quant hCG blood draw today following 2 dose Methotrexate  protocol. She was seen in MAU for abdominal pain on 1/27. She was given Methotrexate  for a ectopic pregnancy on 1/27. She returned for Day 4 and had a significant increase. Was then given a 2nd dosing of methotrexate  on 1/30. Patient denies pain or bleeding, but endorses a mild nagging sensation. Currently rates as a 3/10 today. Discussed with patient, we are following hCG levels today. Results will be back in approximately 2 hours. Given increase in HCG recommends that patient stays until results are complete. Patient agreeable to plan of care.       OB History     Gravida  5   Para  1   Term  1   Preterm      AB  3   Living  1      SAB  1   IAB      Ectopic  2   Multiple  0   Live Births  1           Past Medical History:  Diagnosis Date   Asthma    CIN I (cervical intraepithelial neoplasia I) 10/04/2012   colpo done 09/2012   Trichomonas    Yeast vaginitis     Past Surgical History:  Procedure Laterality Date   CESAREAN SECTION N/A 10/07/2022   Procedure: CESAREAN SECTION;  Surgeon: Henry Slough, MD;  Location: [facility] LD ORS;  Service: Obstetrics;  Laterality: N/A;   DIAGNOSTIC LAPAROSCOPY WITH REMOVAL OF ECTOPIC PREGNANCY Right 10/31/2019   Procedure: DIAGNOSTIC LAPAROSCOPY WITH RIGHT SALPINGECTOMY AND REMOVAL OF ECTOPIC PREGNANCY;  Surgeon: Gloriann Chick, MD;  Location: [facility] OR;  Service: Gynecology;  Laterality: Right;   SALPINGECTOMY Right 10/31/2019    Family History  Problem Relation Age of Onset   Hypertension Mother    Cancer Maternal Grandmother 60       Breast    Diabetes Maternal Grandmother     Social  History[1]  Allergies: Allergies[2]  Medications Prior to Admission  Medication Sig Dispense Refill Last Dose/Taking   cyclobenzaprine  (FLEXERIL ) 10 MG tablet Take 1 tablet (10 mg total) by mouth 2 (two) times daily as needed for muscle spasms. 20 tablet 0 10/26/2024   acetaminophen  (TYLENOL ) 500 MG tablet Take 500 mg by mouth every 6 (six) hours as needed.      fluconazole  (DIFLUCAN ) 150 MG tablet Take 1 tablet (150 mg total) by mouth daily. 1 tablet 1 10/21/2024   fluticasone  (FLONASE ) 50 MCG/ACT nasal spray Place 1 spray into both nostrils daily. 15.8 mL 0 More than a month   meclizine  (ANTIVERT ) 25 MG tablet Take 1 tablet (25 mg total) by mouth 3 (three) times daily as needed for dizziness. 30 tablet 0     Review of Systems Physical Exam   Blood pressure 104/71, pulse 85, temperature 98.4 F (36.9 C), resp. rate 18, height 5' 5 (1.651 m), weight 83.6 kg, last menstrual period 09/16/2024, SpO2 100%, unknown if currently breastfeeding.  Physical Exam  MAU Course  Procedures Orders Placed This Encounter  Procedures   hCG, quantitative, pregnancy   Discharge patient Discharge disposition: 01-Home or Self Care; Discharge patient date:  10/27/2024   Results for orders placed or performed during the hospital encounter of 10/27/24 (from the past 24 hours)  hCG, quantitative, pregnancy     Status: Abnormal   Collection Time: 10/27/24  1:20 PM  Result Value Ref Range   hCG, Beta Chain, Quant, S 7,313 (H) <5 mIU/mL    MDM 1/27- D1- 3691 methotrexate  given  1/30- D4- 7744 2nd methotrexate  given  Today Hcg decreased to 7313. Consulted Dr. Eveline. Per MD decrease in HCG, is reassuring.  - Plan for discharge.   Assessment and Plan   1. Elevated serum human chorionic gonadotropin  (hCG) level   2. 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 [redacted] weeks gestation of pregnancy    - Reviewed decline in HCG. Recommended that patient return to MAU for Day7 as   Claris CHRISTELLA Cedar 10/27/2024, 2:45 PM        [1] Social History Tobacco Use   Smoking status: Never   Smokeless tobacco: Never  Vaping Use   Vaping status: Never Used  Substance Use Topics   Alcohol use: Not Currently    Comment: socially   Drug use: No  [2] Allergies Allergen Reactions   Peanut Oil Itching   Shellfish Allergy Itching  "

## 2024-10-30 ENCOUNTER — Inpatient Hospital Stay (HOSPITAL_COMMUNITY)
Admission: AD | Admit: 2024-10-30 | Discharge: 2024-10-30 | Disposition: A | Discharge status: Home / Self Care | Payer: Self-pay | Source: Ambulatory Visit | Attending: Obstetrics & Gynecology | Admitting: Obstetrics & Gynecology

## 2024-10-30 DIAGNOSIS — O00102 Left tubal pregnancy without intrauterine pregnancy: Secondary | ICD-10-CM

## 2024-10-30 LAB — HCG, QUANTITATIVE, PREGNANCY: hCG, Beta Chain, Quant, S: 5075 m[IU]/mL — ABNORMAL HIGH

## 2024-10-30 NOTE — MAU Provider Note (Incomplete)
 " History     [id number]  Arrival date and time: 10/30/24 9076    Chief Complaint  Patient presents with   Ectopic Pregnancy     HPI Maria Greer is a 34 y.o. at [redacted]w[redacted]d by ***, who presents for ***.       Past Medical History:  Diagnosis Date   Asthma    CIN I (cervical intraepithelial neoplasia I) 10/04/2012   colpo done 09/2012   Trichomonas    Yeast vaginitis     Past Surgical History:  Procedure Laterality Date   CESAREAN SECTION N/A 10/07/2022   Procedure: CESAREAN SECTION;  Surgeon: Henry Slough, MD;  Location: [facility] LD [facility]S;  Service: Obstetrics;  Laterality: N/A;   DIAGNOSTIC LAPAROSCOPY WITH REMOVAL OF ECTOPIC PREGNANCY Right 10/31/2019   Procedure: DIAGNOSTIC LAPAROSCOPY WITH RIGHT SALPINGECTOMY AND REMOVAL OF ECTOPIC PREGNANCY;  Surgeon: Gloriann Chick, MD;  Location: [facility] OR;  Service: Gynecology;  Laterality: Right;   SALPINGECTOMY Right 10/31/2019    Family History  Problem Relation Age of Onset   Hypertension Mother    Cancer Maternal Grandmother 36       Breast    Diabetes Maternal Grandmother     Social History   Socioeconomic History   Marital status: Single    Spouse name: Not on file   Number of children: Not on file   Years of education: Not on file   Highest education level: Not on file  Occupational History   Not on file  Tobacco Use   Smoking status: Never   Smokeless tobacco: Never  Vaping Use   Vaping status: Never Used  Substance and Sexual Activity   Alcohol use: Not Currently    Comment: socially   Drug use: No   Sexual activity: Yes    Birth control/protection: Condom  Other Topics Concern   Not on file  Social History Narrative   Not on file   Social Drivers of Health   Tobacco Use: Low Risk (04/11/2024)   Patient History    Smoking Tobacco Use: Never    Smokeless Tobacco Use: Never    Passive Exposure: Not on file  Financial Resource Strain: Low Risk (09/05/2022)   Received from [facility]   Overall Financial  Resource Strain (CARDIA)    Difficulty of Paying Living Expenses: Not very hard  Food Insecurity: No Food Insecurity (10/06/2022)   Hunger Vital Sign    Worried About Running Out of Food in the Last Year: Never true    Ran Out of Food in the Last Year: Never true  Transportation Needs: No Transportation Needs (10/06/2022)   PRAPARE - Administrator, Civil Service (Medical): No    Lack of Transportation (Non-Medical): No  Physical Activity: Insufficiently Active (09/05/2022)   Received from [facility]   Exercise Vital Sign    On average, how many days per week do you engage in moderate to strenuous exercise (like a brisk walk)?: 2 days    On average, how many minutes do you engage in exercise at this level?: 10 min  Stress: No Stress Concern Present (09/05/2022)   Received from [facility] - Occupational Stress Questionnaire    Feeling of Stress : Not at all  Social Connections: Socially Integrated (09/05/2022)   Received from [facility]   Social Network    How would you rate your social network (family, work, friends)?: Good participation with social networks  Intimate Partner Violence: Not  At Risk (10/06/2022)   Humiliation, Afraid, Rape, and Kick questionnaire    Fear of Current or Ex-Partner: No    Emotionally Abused: No    Physically Abused: No    Sexually Abused: No  Depression (PHQ2-9): Not on file  Alcohol Screen: Not on file  Housing: Low Risk (10/06/2022)   Housing    Last Housing Risk Score: 0  Utilities: Not At Risk (10/06/2022)   AHC Utilities    Threatened with loss of utilities: No  Health Literacy: Not on file    Allergies[1]  Medications Ordered Prior to Encounter[2]  Pertinent positives and negative per HPI, all others reviewed and negative  Physical Exam   BP 109/64   Pulse 82   Temp 98.6 F (37 C)   Resp 18   LMP 09/16/2024   Patient Vitals for the past 24 hrs:  BP Temp Pulse Resp   10/30/24 0942 109/64 98.6 F (37 C) 82 18    Physical Exam ***  Cervical Exam    Bedside Ultrasound {Bedside US :29431}  My interpretation: {MAU Bedside US  Findings:29432}  FHT Baseline: *** bpm Variability: {:31519} Accelerations: {:31520} Decelerations: {FHR DECEL PRESENT:31526} Uterine activity: {Uterine contractions:31516}  Labs Results for orders placed or performed during the hospital encounter of 10/30/24 (from the past 24 hours)  hCG, quantitative, pregnancy     Status: Abnormal   Collection Time: 10/30/24  9:39 AM  Result Value Ref Range   hCG, Beta Chain, Quant, S 5,075 (H) <5 mIU/mL    Imaging No results found.  MAU Course  Procedures  Lab Orders         hCG, quantitative, pregnancy    No orders of the defined types were placed in this encounter.  Imaging Orders  No imaging studies ordered today    MDM {MDM:29430}  Assessment and Plan  #*** weeks gestation of pregnancy # #  #FWB: NST: {NST Interpretation:29428}       Ziyon Soltau L Trevione Wert, MD/MHA 10/30/24 11:08 AM  Allergies as of 10/30/2024       Reactions   Peanut Oil Itching   Shellfish Allergy Itching        Medication List     STOP taking these medications    acetaminophen  500 MG tablet Commonly known as: TYLENOL    fluconazole  150 MG tablet Commonly known as: Diflucan    meclizine  25 MG tablet Commonly known as: ANTIVERT        TAKE these medications    cyclobenzaprine  10 MG tablet Commonly known as: FLEXERIL  Take 1 tablet (10 mg total) by mouth 2 (two) times daily as needed for muscle spasms.   fluticasone  50 MCG/ACT nasal spray Commonly known as: FLONASE  Place 1 spray into both nostrils daily.           [1]  Allergies Allergen Reactions   Peanut Oil Itching   Shellfish Allergy Itching  [2]  No current facility-administered medications on file prior to encounter.   Current Outpatient Medications on File Prior to Encounter  Medication Sig Dispense  Refill   acetaminophen  (TYLENOL ) 500 MG tablet Take 500 mg by mouth every 6 (six) hours as needed.     cyclobenzaprine  (FLEXERIL ) 10 MG tablet Take 1 tablet (10 mg total) by mouth 2 (two) times daily as needed for muscle spasms. 20 tablet 0   fluconazole  (DIFLUCAN ) 150 MG tablet Take 1 tablet (150 mg total) by mouth daily. 1 tablet 1   fluticasone  (FLONASE ) 50 MCG/ACT nasal spray Place 1 spray into both nostrils daily.  15.8 mL 0   meclizine  (ANTIVERT ) 25 MG tablet Take 1 tablet (25 mg total) by mouth 3 (three) times daily as needed for dizziness. 30 tablet 0   "

## 2024-10-30 NOTE — MAU Note (Signed)
 Her for f/u HCG day 7 for 2nd methotrexate  injection. Reports some vag bleeding no  pain . BP 109/64   Pulse 82   Temp 98.6 F (37 C)   Resp 18   LMP 09/16/2024
